# Patient Record
Sex: Male | Born: 1960
Health system: Southern US, Community
[De-identification: ages and names within clinical notes are randomized; demographics above are authoritative.]

## PROBLEM LIST (undated history)

## (undated) DIAGNOSIS — Z86718 Personal history of other venous thrombosis and embolism: Secondary | ICD-10-CM

## (undated) HISTORY — PX: EYE SURGERY: SHX253

## (undated) HISTORY — PX: KNEE SURGERY: SHX244

## (undated) HISTORY — PX: ACHILLES TENDON REPAIR: SUR1153

## (undated) HISTORY — DX: Personal history of other venous thrombosis and embolism: Z86.718

## (undated) HISTORY — PX: HERNIA REPAIR: SHX51

## (undated) HISTORY — PX: ROTATOR CUFF REPAIR: SHX139

---

## 2007-11-21 ENCOUNTER — Emergency Department (HOSPITAL_COMMUNITY): Admission: EM | Admit: 2007-11-21 | Discharge: 2007-11-21 | Payer: Self-pay | Admitting: Emergency Medicine

## 2009-09-21 ENCOUNTER — Emergency Department (HOSPITAL_COMMUNITY): Admission: EM | Admit: 2009-09-21 | Discharge: 2009-09-21 | Payer: Self-pay | Admitting: Family Medicine

## 2009-09-25 ENCOUNTER — Ambulatory Visit (HOSPITAL_BASED_OUTPATIENT_CLINIC_OR_DEPARTMENT_OTHER): Admission: RE | Admit: 2009-09-25 | Discharge: 2009-09-25 | Payer: Self-pay | Admitting: Orthopedic Surgery

## 2010-08-26 LAB — POCT I-STAT, CHEM 8
BUN: 17 mg/dL (ref 6–23)
Chloride: 107 mEq/L (ref 96–112)
Creatinine, Ser: 1 mg/dL (ref 0.4–1.5)
HCT: 48 % (ref 39.0–52.0)
Hemoglobin: 16.3 g/dL (ref 13.0–17.0)
Potassium: 4.8 mEq/L (ref 3.5–5.1)
TCO2: 27 mmol/L (ref 0–100)

## 2011-03-05 LAB — PROTIME-INR: Prothrombin Time: 13.3

## 2016-12-31 ENCOUNTER — Ambulatory Visit (INDEPENDENT_AMBULATORY_CARE_PROVIDER_SITE_OTHER): Payer: BLUE CROSS/BLUE SHIELD | Admitting: Family

## 2016-12-31 ENCOUNTER — Encounter (INDEPENDENT_AMBULATORY_CARE_PROVIDER_SITE_OTHER): Payer: Self-pay | Admitting: *Deleted

## 2016-12-31 ENCOUNTER — Encounter: Payer: Self-pay | Admitting: Family

## 2016-12-31 VITALS — BP 97/67 | HR 119 | Temp 98.8°F | Ht 67.0 in | Wt 189.4 lb

## 2016-12-31 DIAGNOSIS — R000 Tachycardia, unspecified: Secondary | ICD-10-CM | POA: Diagnosis not present

## 2016-12-31 DIAGNOSIS — R509 Fever, unspecified: Secondary | ICD-10-CM | POA: Diagnosis not present

## 2016-12-31 DIAGNOSIS — R5383 Other fatigue: Secondary | ICD-10-CM

## 2016-12-31 DIAGNOSIS — R9431 Abnormal electrocardiogram [ECG] [EKG]: Secondary | ICD-10-CM | POA: Diagnosis not present

## 2016-12-31 DIAGNOSIS — Z1211 Encounter for screening for malignant neoplasm of colon: Secondary | ICD-10-CM | POA: Diagnosis not present

## 2016-12-31 NOTE — Patient Instructions (Signed)
Viral Illness, Adult Viruses are tiny germs that can get into a person's body and cause illness. There are many different types of viruses, and they cause many types of illness. Viral illnesses can range from mild to severe. They can affect various parts of the body. Common illnesses that are caused by a virus include colds and the flu. Viral illnesses also include serious conditions such as HIV/AIDS (human immunodeficiency virus/acquired immunodeficiency syndrome). A few viruses have been linked to certain cancers. What are the causes? Many types of viruses can cause illness. Viruses invade cells in your body, multiply, and cause the infected cells to malfunction or die. When the cell dies, it releases more of the virus. When this happens, you develop symptoms of the illness, and the virus continues to spread to other cells. If the virus takes over the function of the cell, it can cause the cell to divide and grow out of control, as is the case when a virus causes cancer. Different viruses get into the body in different ways. You can get a virus by:  Swallowing food or water that is contaminated with the virus.  Breathing in droplets that have been coughed or sneezed into the air by an infected person.  Touching a surface that has been contaminated with the virus and then touching your eyes, nose, or mouth.  Being bitten by an insect or animal that carries the virus.  Having sexual contact with a person who is infected with the virus.  Being exposed to blood or fluids that contain the virus, either through an open cut or during a transfusion.  If a virus enters your body, your body's defense system (immune system) will try to fight the virus. You may be at higher risk for a viral illness if your immune system is weak. What are the signs or symptoms? Symptoms vary depending on the type of virus and the location of the cells that it invades. Common symptoms of the main types of viral illnesses  include: Cold and flu viruses  Fever.  Headache.  Sore throat.  Muscle aches.  Nasal congestion.  Cough. Digestive system (gastrointestinal) viruses  Fever.  Abdominal pain.  Nausea.  Diarrhea. Liver viruses (hepatitis)  Loss of appetite.  Tiredness.  Yellowing of the skin (jaundice). Brain and spinal cord viruses  Fever.  Headache.  Stiff neck.  Nausea and vomiting.  Confusion or sleepiness. Skin viruses  Warts.  Itching.  Rash. Sexually transmitted viruses  Discharge.  Swelling.  Redness.  Rash. How is this treated? Viruses can be difficult to treat because they live within cells. Antibiotic medicines do not treat viruses because these drugs do not get inside cells. Treatment for a viral illness may include:  Resting and drinking plenty of fluids.  Medicines to relieve symptoms. These can include over-the-counter medicine for pain and fever, medicines for cough or congestion, and medicines to relieve diarrhea.  Antiviral medicines. These drugs are available only for certain types of viruses. They may help reduce flu symptoms if taken early. There are also many antiviral medicines for hepatitis and HIV/AIDS.  Some viral illnesses can be prevented with vaccinations. A common example is the flu shot. Follow these instructions at home: Medicines   Take over-the-counter and prescription medicines only as told by your health care provider.  If you were prescribed an antiviral medicine, take it as told by your health care provider. Do not stop taking the medicine even if you start to feel better.  Be aware   of when antibiotics are needed and when they are not needed. Antibiotics do not treat viruses. If your health care provider thinks that you may have a bacterial infection as well as a viral infection, you may get an antibiotic. ? Do not ask for an antibiotic prescription if you have been diagnosed with a viral illness. That will not make your  illness go away faster. ? Frequently taking antibiotics when they are not needed can lead to antibiotic resistance. When this develops, the medicine no longer works against the bacteria that it normally fights. General instructions  Drink enough fluids to keep your urine clear or pale yellow.  Rest as much as possible.  Return to your normal activities as told by your health care provider. Ask your health care provider what activities are safe for you.  Keep all follow-up visits as told by your health care provider. This is important. How is this prevented? Take these actions to reduce your risk of viral infection:  Eat a healthy diet and get enough rest.  Wash your hands often with soap and water. This is especially important when you are in public places. If soap and water are not available, use hand sanitizer.  Avoid close contact with friends and family who have a viral illness.  If you travel to areas where viral gastrointestinal infection is common, avoid drinking water or eating raw food.  Keep your immunizations up to date. Get a flu shot every year as told by your health care provider.  Do not share toothbrushes, nail clippers, razors, or needles with other people.  Always practice safe sex.  Contact a health care provider if:  You have symptoms of a viral illness that do not go away.  Your symptoms come back after going away.  Your symptoms get worse. Get help right away if:  You have trouble breathing.  You have a severe headache or a stiff neck.  You have severe vomiting or abdominal pain. This information is not intended to replace advice given to you by your health care provider. Make sure you discuss any questions you have with your health care provider. Document Released: 10/04/2015 Document Revised: 11/06/2015 Document Reviewed: 10/04/2015 Elsevier Interactive Patient Education  2018 Elsevier Inc.  

## 2016-12-31 NOTE — Progress Notes (Signed)
Subjective:    Patient ID: Jay Shaw, male    DOB: 1961/02/03, 56 y.o.   MRN: [id number]  PT presents to the office today to establish care and complaints of fever/chills.  Fever   This is a new problem. The current episode started in the past 7 days. The problem occurs intermittently. The problem has been waxing and waning. Associated symptoms include sleepiness. Pertinent negatives include no congestion, coughing, diarrhea, ear pain, muscle aches, nausea, rash, sore throat, urinary pain or vomiting. Associated symptoms comments: Chills . He has tried acetaminophen for the symptoms. The treatment provided mild relief.      Review of Systems  Constitutional: Positive for chills, diaphoresis, fatigue and fever.  HENT: Negative for congestion, ear pain and sore throat.   Respiratory: Negative for cough.   Gastrointestinal: Negative for diarrhea, nausea and vomiting.  Genitourinary: Negative for dysuria.  Skin: Negative for rash.  All other systems reviewed and are negative.  Family History  Problem Relation Age of Onset  . Cancer Father   . COPD Father   . Heart disease Father    Social History   Social History  . Marital status: Married    Spouse name: N/A  . Number of children: N/A  . Years of education: N/A   Social History Main Topics  . Smoking status: Never Smoker  . Smokeless tobacco: Never Used  . Alcohol use No  . Drug use: No  . Sexual activity: Not Asked   Other Topics Concern  . None   Social History Narrative  . None        Objective:   Physical Exam  Constitutional: He is oriented to person, place, and time. He appears well-developed and well-nourished. No distress.  HENT:  Head: Normocephalic.  Right Ear: External ear normal.  Left Ear: External ear normal.  Nose: Nose normal.  Mouth/Throat: Oropharynx is clear and moist.  Eyes: Pupils are equal, round, and reactive to light. Right eye exhibits no discharge. Left eye exhibits no  discharge.  Neck: Normal range of motion. Neck supple. No thyromegaly present.  Cardiovascular: Normal rate, regular rhythm, normal heart sounds and intact distal pulses.   No murmur heard. Pulmonary/Chest: Effort normal and breath sounds normal. No respiratory distress. He has no wheezes.  Abdominal: Soft. Bowel sounds are normal. He exhibits no distension. There is no tenderness.  Musculoskeletal: Normal range of motion. He exhibits no edema or tenderness.  Neurological: He is alert and oriented to person, place, and time.  Skin: Skin is warm and dry. No rash noted. No erythema.  Psychiatric: He has a normal mood and affect. His behavior is normal. Judgment and thought content normal.  Vitals reviewed.   BP 97/67   Pulse (!) 119   Temp 98.8 F (37.1 C) (Oral)   Ht '5\' 7"'  (1.702 m)   Wt 189 lb 6.4 oz (85.9 kg)   BMI 29.66 kg/m      Assessment & Plan:  1. Fever, unspecified fever cause - CMP14+EGFR - CBC with Differential/Platelet - Lyme Ab/Western Blot Reflex - Rocky mtn spotted fvr abs pnl(IgG+IgM)  2. Tachycardia - CMP14+EGFR - CBC with Differential/Platelet - EKG 12-Lead  3. Abnormal EKG - Ambulatory referral to Cardiology  4. Other fatigue - Lyme Ab/Western Blot Reflex - Rocky mtn spotted fvr abs pnl(IgG+IgM) - Ambulatory referral to Cardiology  Labs pending Force fluids  Tylenol as needed Will do referral to Cardiologists  Will call this viral until lab work comes back, if  symptoms worsen or do not improve RTO and will will have further work up  Evelina Dun, FNP

## 2016-12-31 NOTE — Addendum Note (Signed)
Addended by: Evelina Dun A on: 12/31/2016 09:35 AM   Modules accepted: Orders

## 2017-01-04 LAB — CMP14+EGFR
A/G RATIO: 1.3 (ref 1.2–2.2)
ALT: 32 IU/L (ref 0–44)
AST: 38 IU/L (ref 0–40)
Albumin: 3.5 g/dL (ref 3.5–5.5)
Alkaline Phosphatase: 77 IU/L (ref 39–117)
BUN/Creatinine Ratio: 18 (ref 9–20)
BUN: 17 mg/dL (ref 6–24)
Bilirubin Total: 0.9 mg/dL (ref 0.0–1.2)
CALCIUM: 8.9 mg/dL (ref 8.7–10.2)
CO2: 18 mmol/L — AB (ref 20–29)
CREATININE: 0.92 mg/dL (ref 0.76–1.27)
Chloride: 105 mmol/L (ref 96–106)
GFR, EST AFRICAN AMERICAN: 107 mL/min/{1.73_m2} (ref >59)
GFR, EST NON AFRICAN AMERICAN: 93 mL/min/{1.73_m2} (ref >59)
Globulin, Total: 2.8 g/dL (ref 1.5–4.5)
Glucose: 87 mg/dL (ref 65–99)
Potassium: 4.1 mmol/L (ref 3.5–5.2)
Sodium: 143 mmol/L (ref 134–144)
TOTAL PROTEIN: 6.3 g/dL (ref 6.0–8.5)

## 2017-01-04 LAB — CBC WITH DIFFERENTIAL/PLATELET
BASOS ABS: 0.1 10*3/uL (ref 0.0–0.2)
BASOS: 2 %
EOS (ABSOLUTE): 0 10*3/uL (ref 0.0–0.4)
EOS: 0 %
HEMOGLOBIN: 12.7 g/dL — AB (ref 13.0–17.7)
Hematocrit: 37.9 % (ref 37.5–51.0)
IMMATURE GRANS (ABS): 0 10*3/uL (ref 0.0–0.1)
Immature Granulocytes: 1 %
LYMPHS ABS: 3.2 10*3/uL — AB (ref 0.7–3.1)
LYMPHS: 62 %
MCH: 29.3 pg (ref 26.6–33.0)
MCHC: 33.5 g/dL (ref 31.5–35.7)
MCV: 88 fL (ref 79–97)
MONOCYTES: 15 %
Monocytes Absolute: 0.8 10*3/uL (ref 0.1–0.9)
NEUTROS ABS: 1 10*3/uL — AB (ref 1.4–7.0)
NEUTROS PCT: 20 %
PLATELETS: 110 10*3/uL — AB (ref 150–379)
RBC: 4.33 x10E6/uL (ref 4.14–5.80)
RDW: 13.7 % (ref 12.3–15.4)
WBC: 5.2 10*3/uL (ref 3.4–10.8)

## 2017-01-04 LAB — ROCKY MTN SPOTTED FVR ABS PNL(IGG+IGM)
RMSF IGG: NEGATIVE
RMSF IGM: 0.52 {index} (ref 0.00–0.89)

## 2017-01-04 LAB — LYME AB/WESTERN BLOT REFLEX: LYME DISEASE AB, QUANT, IGM: <0.8 index (ref 0.00–0.79)

## 2017-01-18 ENCOUNTER — Encounter: Payer: Self-pay | Admitting: Cardiovascular Disease

## 2017-01-18 ENCOUNTER — Ambulatory Visit (INDEPENDENT_AMBULATORY_CARE_PROVIDER_SITE_OTHER): Payer: BLUE CROSS/BLUE SHIELD | Admitting: Cardiovascular Disease

## 2017-01-18 VITALS — BP 102/68 | HR 67 | Ht 67.0 in | Wt 184.0 lb

## 2017-01-18 DIAGNOSIS — R9431 Abnormal electrocardiogram [ECG] [EKG]: Secondary | ICD-10-CM

## 2017-01-18 NOTE — Patient Instructions (Signed)
Medication Instructions:  Continue all current medications.  Labwork: none  Testing/Procedures:  Your physician has requested that you have an echocardiogram. Echocardiography is a painless test that uses sound waves to create images of your heart. It provides your doctor with information about the size and shape of your heart and how well your heart's chambers and valves are working. This procedure takes approximately one hour. There are no restrictions for this procedure.  Office will contact with results via phone or letter.    Follow-Up: To be determined.    Any Other Special Instructions Will Be Listed Below (If Applicable).  If you need a refill on your cardiac medications before your next appointment, please call your pharmacy.  

## 2017-01-18 NOTE — Progress Notes (Signed)
CARDIOLOGY CONSULT NOTE  Patient ID: Jay Shaw MRN: [id number] DOB/AGE: 1961-02-15 56 y.o.  Admit date: (Not on file) Primary Physician: Sharion Balloon, FNP Referring Physician: Lenna Gilford  Reason for Consultation: abnormal ECG  HPI: Jay Shaw is a 56 y.o. male who is being seen today for the evaluation of abnormal ECG at the request of Sharion Balloon, FNP.   He was evaluated for a fever by his PCP in late July 2018. Lyme titers and testing from [facility] spotted fever were negative.  ECG performed that day which I personally interpreted demonstrated sinus rhythm with left axis deviation and late R-wave transition suggestive of anterior infarct.  He takes no medications.  His only significant past medical history is a provoked right leg DVT 5 years ago. At that time his job entailed extensive airplane and car travel. He has no family history of clotting disorders.  He walks about 2.75 miles twice per week. He has been on a ketogenic diet and has lost 28 pounds in the last several months.  He does yard work regularly.  The patient denies any symptoms of chest pain, palpitations, shortness of breath, lightheadedness, dizziness, leg swelling, orthopnea, PND, and syncope.  Family history: His father was a smoker and alcoholic. He had CAD and CVA and died of COPD at the age of 13. His mom was a smoker.  Soc Hx: He is originally from [place], [place]. He owns a [employer]. His father was an Therapist, music and [place] for 40 years and worked for [employer] ". The patient studied at the [employer] [place].   No Known Allergies  No current outpatient prescriptions on file.   No current facility-administered medications for this visit.     Past Medical History:  Diagnosis Date  . Hx of blood clots    blood clot lower extrmity    Past Surgical History:  Procedure  Laterality Date  . ACHILLES TENDON REPAIR Left   . EYE SURGERY    . HERNIA REPAIR    . KNEE SURGERY Right   . ROTATOR CUFF REPAIR Right     Social History   Social History  . Marital status: Married    Spouse name: N/A  . Number of children: N/A  . Years of education: N/A   Occupational History  . Not on file.   Social History Main Topics  . Smoking status: Never Smoker  . Smokeless tobacco: Never Used  . Alcohol use No  . Drug use: No  . Sexual activity: Not on file   Other Topics Concern  . Not on file   Social History Narrative  . No narrative on file      No outpatient prescriptions have been marked as taking for the 01/18/17 encounter (Office Visit) with Herminio Commons, MD.      Review of systems complete and found to be negative unless listed above in HPI    Physical exam Blood pressure 102/68, pulse 67, height 5\' 7"  (1.702 m), weight 184 lb (83.5 kg), SpO2 97 %. General: NAD Neck: No JVD, no thyromegaly or thyroid nodule.  Lungs: Clear to auscultation bilaterally with normal respiratory effort. CV: Nondisplaced PMI. Regular rate and rhythm, normal S1/S2, no S3/S4, no murmur.  No peripheral edema.  No carotid bruit.    Abdomen: Soft, nontender, no distention.  Skin: Intact without lesions or rashes.  Neurologic: Alert and oriented x 3.  Psych: Normal affect. Extremities: No clubbing or cyanosis.  HEENT: Normal.   ECG: Most recent ECG reviewed.   Labs: Lab Results  Component Value Date/Time   K 4.1 12/31/2016 09:35 AM   BUN 17 12/31/2016 09:35 AM   CREATININE 0.92 12/31/2016 09:35 AM   ALT 32 12/31/2016 09:35 AM   HGB 12.7 (L) 12/31/2016 09:35 AM     Lipids: No results found for: LDLCALC, LDLDIRECT, CHOL, TRIG, HDL      ASSESSMENT AND PLAN:  1. Abnormal ECG: Given the lack of cardiovascular risk factors and lack of symptoms, I suspect this is a nonspecific finding. However, I will order a 2-D echocardiogram with Doppler to evaluate  cardiac structure, function, and regional wall motion.    Disposition: Follow up to be determined.   Signed: Kate Sable, M.D., F.A.C.C.  01/18/2017, 8:24 AM

## 2017-02-04 ENCOUNTER — Other Ambulatory Visit: Payer: Self-pay

## 2017-02-04 ENCOUNTER — Ambulatory Visit (INDEPENDENT_AMBULATORY_CARE_PROVIDER_SITE_OTHER): Payer: BLUE CROSS/BLUE SHIELD

## 2017-02-04 DIAGNOSIS — R9431 Abnormal electrocardiogram [ECG] [EKG]: Secondary | ICD-10-CM | POA: Diagnosis not present

## 2017-02-05 ENCOUNTER — Telehealth: Payer: Self-pay | Admitting: *Deleted

## 2017-02-05 NOTE — Telephone Encounter (Signed)
Notes recorded by Laurine Blazer, LPN on 12/01/6387 at 3:73 PM EDT Patient notified. Copy to pmd. ------  Notes recorded by Herminio Commons, MD on 02/05/2017 at 8:10 AM EDT Normal.

## 2018-12-14 ENCOUNTER — Encounter: Payer: Self-pay | Admitting: Family Medicine

## 2018-12-14 ENCOUNTER — Other Ambulatory Visit: Payer: Self-pay

## 2018-12-14 ENCOUNTER — Ambulatory Visit (INDEPENDENT_AMBULATORY_CARE_PROVIDER_SITE_OTHER): Payer: BC Managed Care – PPO | Admitting: Family Medicine

## 2018-12-14 DIAGNOSIS — H6593 Unspecified nonsuppurative otitis media, bilateral: Secondary | ICD-10-CM | POA: Diagnosis not present

## 2018-12-14 DIAGNOSIS — H9202 Otalgia, left ear: Secondary | ICD-10-CM

## 2018-12-14 MED ORDER — FLUTICASONE PROPIONATE 50 MCG/ACT NA SUSP: 2.0000 | Freq: Every day | NASAL | 6 refills | Status: DC

## 2018-12-14 MED ORDER — AMOXICILLIN 875 MG PO TABS: 875.0000 mg | ORAL_TABLET | Freq: Two times a day (BID) | ORAL | 0 refills | Status: DC

## 2018-12-14 MED ORDER — LEVOCETIRIZINE DIHYDROCHLORIDE 5 MG PO TABS
5.0000 mg | ORAL_TABLET | Freq: Every evening | ORAL | 3 refills | Status: DC
Start: 2018-12-14 — End: 2021-06-18

## 2018-12-14 NOTE — Progress Notes (Signed)
Virtual Visit via telephone Note Due to COVID-19, visit is conducted virtually and was requested by patient. This visit type was conducted due to national recommendations for restrictions regarding the COVID-19 Pandemic (e.g. social distancing) in an effort to limit this patient's exposure and mitigate transmission in our community. All issues noted in this document were discussed and addressed.  A physical exam was not performed with this format.   I connected with Jay Shaw on 12/14/18 at 1155 by telephone and verified that I am speaking with the correct person using two identifiers. Jay Shaw is currently located at home and no one is currently with them during visit. The provider, Monia Pouch, FNP is located in their home office at time of visit, working remotely.  I discussed the limitations, risks, security and privacy concerns of performing an evaluation and management service by telephone and the availability of in person appointments. I also discussed with the patient that there may be a patient responsible charge related to this service. The patient expressed understanding and agreed to proceed.  Subjective:  Patient ID: Jay Shaw, male    DOB: 11-16-1960, 58 y.o.   MRN: [id number]  Chief Complaint:  Otalgia and Ear Fullness   HPI: Jay Shaw is a 58 y.o. male presenting on 12/14/2018 for Otalgia and Ear Fullness   Pt reports ongoing bilateral ear fullness and tinnitus. States this started several weeks ago. States the symptoms wax and wane. States on Monday he developed left ear pain. States he has a 4/10 aching pain in his ear. States this has been constant. He has tried peroxide and herbal remedies without relief of symptoms. States he does have decreased hearing in both ears.   Otalgia  There is pain in the left ear. This is a new problem. The current episode started in the past 7 days. The problem occurs constantly. There has been no fever. The pain is at a  severity of 4/10. The pain is mild. Associated symptoms include hearing loss. Pertinent negatives include no abdominal pain, coughing, diarrhea, ear discharge, headaches, neck pain, rash, rhinorrhea, sore throat or vomiting. The treatment provided no relief.  Ear Fullness  There is pain in both ears. This is a recurrent problem. The current episode started 1 to 4 weeks ago. The problem occurs every few hours. The problem has been waxing and waning. There has been no fever. Associated symptoms include hearing loss. Pertinent negatives include no abdominal pain, coughing, diarrhea, ear discharge, headaches, neck pain, rash, rhinorrhea, sore throat or vomiting. He has tried nothing for the symptoms.     Relevant past medical, surgical, family, and social history reviewed and updated as indicated.  Allergies and medications reviewed and updated.   Past Medical History:  Diagnosis Date  . Hx of blood clots    blood clot lower extrmity    Past Surgical History:  Procedure Laterality Date  . ACHILLES TENDON REPAIR Left   . EYE SURGERY    . HERNIA REPAIR    . KNEE SURGERY Right   . ROTATOR CUFF REPAIR Right     Social History   Socioeconomic History  . Marital status: Married    Spouse name: Not on file  . Number of children: Not on file  . Years of education: Not on file  . Highest education level: Not on file  Occupational History  . Not on file  Social Needs  . Financial resource strain: Not on file  . Food insecurity    Worry:  Not on file    Inability: Not on file  . Transportation needs    Medical: Not on file    Non-medical: Not on file  Tobacco Use  . Smoking status: Never Smoker  . Smokeless tobacco: Never Used  Substance and Sexual Activity  . Alcohol use: No  . Drug use: No  . Sexual activity: Not on file  Lifestyle  . Physical activity    Days per week: Not on file    Minutes per session: Not on file  . Stress: Not on file  Relationships  . Social  Herbalist on phone: Not on file    Gets together: Not on file    Attends religious service: Not on file    Active member of club or organization: Not on file    Attends meetings of clubs or organizations: Not on file    Relationship status: Not on file  . Intimate partner violence    Fear of current or ex partner: Not on file    Emotionally abused: Not on file    Physically abused: Not on file    Forced sexual activity: Not on file  Other Topics Concern  . Not on file  Social History Narrative  . Not on file    Outpatient Encounter Medications as of 12/14/2018  Medication Sig  . amoxicillin (AMOXIL) 875 MG tablet Take 1 tablet (875 mg total) by mouth 2 (two) times daily. 1 po BID  . fluticasone (FLONASE) 50 MCG/ACT nasal spray Place 2 sprays into both nostrils daily.  Marland Kitchen levocetirizine (XYZAL) 5 MG tablet Take 1 tablet (5 mg total) by mouth every evening.   No facility-administered encounter medications on file as of 12/14/2018.     No Known Allergies  Review of Systems  Constitutional: Negative for chills, fatigue and fever.  HENT: Positive for ear pain, hearing loss and tinnitus. Negative for congestion, dental problem, drooling, ear discharge, facial swelling, mouth sores, nosebleeds, postnasal drip, rhinorrhea, sinus pressure, sinus pain, sneezing, sore throat, trouble swallowing and voice change.   Respiratory: Negative for cough and shortness of breath.   Cardiovascular: Negative for chest pain and palpitations.  Gastrointestinal: Negative for abdominal pain, diarrhea and vomiting.  Musculoskeletal: Negative for arthralgias, myalgias and neck pain.  Skin: Negative for rash.  Neurological: Negative for dizziness, syncope, weakness, light-headedness and headaches.  Psychiatric/Behavioral: Negative for confusion.  All other systems reviewed and are negative.        Observations/Objective: No vital signs or physical exam, this was a telephone or virtual health  encounter.  Pt alert and oriented, answers all questions appropriately, and able to speak in full sentences.    Assessment and Plan: Jay Shaw was seen today for otalgia and ear fullness.  Diagnoses and all orders for this visit:  Otalgia of left ear Reported symptoms consistent with left AOM. Symptomatic care discussed. Report any new or worsening symptoms. Medications as prescribed. Follow up in 2 weeks for reevaluation.  -     amoxicillin (AMOXIL) 875 MG tablet; Take 1 tablet (875 mg total) by mouth 2 (two) times daily. 1 po BID  Bilateral otitis media with effusion Reported symptoms consistent with bilateral otitis media with effusion. Will trial below. Follow up in 2 weeks for reevaluation. May need referral to ENT if symptoms persist.  -     fluticasone (FLONASE) 50 MCG/ACT nasal spray; Place 2 sprays into both nostrils daily. -     levocetirizine (XYZAL) 5 MG tablet; Take  1 tablet (5 mg total) by mouth every evening.     Follow Up Instructions: Return in about 2 weeks (around 12/28/2018), or if symptoms worsen or fail to improve, for ear recheck.    I discussed the assessment and treatment plan with the patient. The patient was provided an opportunity to ask questions and all were answered. The patient agreed with the plan and demonstrated an understanding of the instructions.   The patient was advised to call back or seek an in-person evaluation if the symptoms worsen or if the condition fails to improve as anticipated.  The above assessment and management plan was discussed with the patient. The patient verbalized understanding of and has agreed to the management plan. Patient is aware to call the clinic if symptoms persist or worsen. Patient is aware when to return to the clinic for a follow-up visit. Patient educated on when it is appropriate to go to the emergency department.    I provided 15 minutes of non-face-to-face time during this encounter. The call started at 1155.  The call ended at 1210. The other time was used for coordination of care.    Monia Pouch, FNP-C [facility] [address] [place], [place] [address] [phone]

## 2019-01-02 ENCOUNTER — Encounter: Payer: Self-pay | Admitting: Family

## 2019-01-02 ENCOUNTER — Ambulatory Visit (INDEPENDENT_AMBULATORY_CARE_PROVIDER_SITE_OTHER): Payer: BC Managed Care – PPO | Admitting: Family

## 2019-01-02 DIAGNOSIS — H938X2 Other specified disorders of left ear: Secondary | ICD-10-CM

## 2019-01-02 DIAGNOSIS — H9312 Tinnitus, left ear: Secondary | ICD-10-CM | POA: Diagnosis not present

## 2019-01-02 NOTE — Progress Notes (Signed)
   Virtual Visit via telephone Note  I connected with Jay Shaw on 01/02/19 at 10:56 AM by telephone and verified that I am speaking with the correct person using two identifiers. Jay Shaw is currently located at home and no one  is currently with her during visit. The provider, Evelina Dun, FNP is located in their office at time of visit.  I discussed the limitations, risks, security and privacy concerns of performing an evaluation and management service by telephone and the availability of in person appointments. I also discussed with the patient that there may be a patient responsible charge related to this service. The patient expressed understanding and agreed to proceed.   History and Present Illness:  PT presents to the office today with recurrent left ear fullness. He had a televisit on 12/14/2018 and given amoxicillin, Xyzal, and Flonase. He states he also has tinnitus that has been going on for greater than 6 months. 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 This is a recurrent problem. The current episode started 1 to 4 weeks ago. The problem occurs constantly. The problem has been waxing and waning. There has been no fever. The pain is at a severity of 0/10. The patient is experiencing no pain. Associated symptoms include hearing loss. Pertinent negatives include no coughing, diarrhea, ear discharge, headaches or sore throat. He has tried antibiotics for the symptoms. The treatment provided mild relief.      Review of Systems  HENT: Positive for hearing loss and tinnitus. Negative for ear discharge and sore throat.   Respiratory: Negative for cough.   Gastrointestinal: Negative for diarrhea.  Neurological: Negative for headaches.  All other systems reviewed and are negative.    Observations/Objective: No SOB or distress noted   Assessment and Plan: Jay Shaw comes in today with chief complaint of  No chief complaint on file.   Diagnosis and orders addressed:  1. Sensation of fullness in left ear  2. Tinnitus of left ear  Given he is not having any pain, fever, cough, or SOB we will bring him in tomorrow to check his ears as it may be cerumen impaction. He states he does have a hx of cerumen impaction. If ears are clear we will do a referral to ENT.  He will also make CPE appt    I discussed the assessment and treatment plan with the patient. The patient was provided an opportunity to ask questions and all were answered. The patient agreed with the plan and demonstrated an understanding of the instructions.   The patient was advised to call back or seek an in-person evaluation if the symptoms worsen or if the condition fails to improve as anticipated.  The above assessment and management plan was discussed with the patient. The patient verbalized understanding of and has agreed to the management plan. Patient is aware to call the clinic if symptoms persist or worsen. Patient is aware when to return to the clinic for a follow-up visit. Patient educated on when it is appropriate to go to the emergency department.   Time call ended:  11:12 AM  I provided 15 minutes of non-face-to-face time during this encounter.    Evelina Dun, FNP

## 2019-01-03 ENCOUNTER — Ambulatory Visit: Payer: BC Managed Care – PPO | Admitting: Family

## 2019-01-03 ENCOUNTER — Other Ambulatory Visit: Payer: Self-pay

## 2019-01-03 ENCOUNTER — Encounter: Payer: Self-pay | Admitting: Family

## 2019-01-03 VITALS — BP 117/71 | HR 97 | Temp 98.4°F | Ht 67.0 in | Wt 179.4 lb

## 2019-01-03 DIAGNOSIS — H9312 Tinnitus, left ear: Secondary | ICD-10-CM

## 2019-01-03 DIAGNOSIS — H6122 Impacted cerumen, left ear: Secondary | ICD-10-CM | POA: Diagnosis not present

## 2019-01-03 NOTE — Patient Instructions (Signed)
Earwax Buildup, Adult The ears produce a substance called earwax that helps keep bacteria out of the ear and protects the skin in the ear canal. Occasionally, earwax can build up in the ear and cause discomfort or hearing loss. What increases the risk? This condition is more likely to develop in people who:  Are male.  Are elderly.  Naturally produce more earwax.  Clean their ears often with cotton swabs.  Use earplugs often.  Use in-ear headphones often.  Wear hearing aids.  Have narrow ear canals.  Have earwax that is overly thick or sticky.  Have eczema.  Are dehydrated.  Have excess hair in the ear canal. What are the signs or symptoms? Symptoms of this condition include:  Reduced or muffled hearing.  A feeling of fullness in the ear or feeling that the ear is plugged.  Fluid coming from the ear.  Ear pain.  Ear itch.  Ringing in the ear.  Coughing.  An obvious piece of earwax that can be seen inside the ear canal. How is this diagnosed? This condition may be diagnosed based on:  Your symptoms.  Your medical history.  An ear exam. During the exam, your health care provider will look into your ear with an instrument called an otoscope. You may have tests, including a hearing test. How is this treated? This condition may be treated by:  Using ear drops to soften the earwax.  Having the earwax removed by a health care provider. The health care provider may: ? Flush the ear with water. ? Use an instrument that has a loop on the end (curette). ? Use a suction device.  Surgery to remove the wax buildup. This may be done in severe cases. Follow these instructions at home:   Take over-the-counter and prescription medicines only as told by your health care provider.  Do not put any objects, including cotton swabs, into your ear. You can clean the opening of your ear canal with a washcloth or facial tissue.  Follow instructions from your health care  provider about cleaning your ears. Do not over-clean your ears.  Drink enough fluid to keep your urine clear or pale yellow. This will help to thin the earwax.  Keep all follow-up visits as told by your health care provider. If earwax builds up in your ears often or if you use hearing aids, consider seeing your health care provider for routine, preventive ear cleanings. Ask your health care provider how often you should schedule your cleanings.  If you have hearing aids, clean them according to instructions from the manufacturer and your health care provider. Contact a health care provider if:  You have ear pain.  You develop a fever.  You have blood, pus, or other fluid coming from your ear.  You have hearing loss.  You have ringing in your ears that does not go away.  Your symptoms do not improve with treatment.  You feel like the room is spinning (vertigo). Summary  Earwax can build up in the ear and cause discomfort or hearing loss.  The most common symptoms of this condition include reduced or muffled hearing and a feeling of fullness in the ear or feeling that the ear is plugged.  This condition may be diagnosed based on your symptoms, your medical history, and an ear exam.  This condition may be treated by using ear drops to soften the earwax or by having the earwax removed by a health care provider.  Do not put any   objects, including cotton swabs, into your ear. You can clean the opening of your ear canal with a washcloth or facial tissue. This information is not intended to replace advice given to you by your health care provider. Make sure you discuss any questions you have with your health care provider. Document Released: 07/02/2004 Document Revised: 05/07/2017 Document Reviewed: 08/05/2016 Elsevier Patient Education  2020 [employer]  

## 2019-01-03 NOTE — Progress Notes (Signed)
Subjective:    Patient ID: Jay Shaw, male    DOB: Apr 27, 1961, 58 y.o.   MRN: [id number]  Chief Complaint  Patient presents with  . Ear Fullness   PT presents to the office today with left ear fullness and Tinnitus. 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  Ear Fullness  There is pain in the left ear. This is a recurrent problem. The current episode started more than 1 month ago. The problem occurs constantly. The problem has been waxing and waning. There has been no fever. The pain is at a severity of 1/10. The pain is mild. Associated symptoms include hearing loss. Pertinent negatives include no coughing, diarrhea, headaches or sore throat. He has tried antibiotics, acetaminophen and NSAIDs for the symptoms. The treatment provided no relief.      Review of Systems  HENT: Positive for hearing loss. Negative for sore throat.   Respiratory: Negative for cough.   Gastrointestinal: Negative for diarrhea.  Neurological: Negative for headaches.  All other systems reviewed and are negative.      Objective:   Physical Exam Vitals signs reviewed.  Constitutional:      General: He is not in acute distress.    Appearance: He is well-developed.  HENT:     Head: Normocephalic.     Right Ear: Tympanic membrane normal.     Left Ear: Tympanic membrane normal. There is impacted cerumen.  Eyes:     General:        Right eye: No discharge.        Left eye: No discharge.     Pupils: Pupils are equal, round, and reactive to light.  Neck:     Musculoskeletal: Normal range of motion and neck supple.     Thyroid: No thyromegaly.  Cardiovascular:     Rate and Rhythm: Normal rate and regular rhythm.     Heart sounds: Normal heart sounds. No murmur.  Pulmonary:     Effort: Pulmonary effort is normal. No respiratory distress.     Breath sounds: Normal breath sounds. No wheezing.  Abdominal:     General: Bowel sounds are normal. There is no distension.   Palpations: Abdomen is soft.     Tenderness: There is no abdominal tenderness.  Musculoskeletal: Normal range of motion.        General: No tenderness.  Skin:    General: Skin is warm and dry.     Findings: No erythema or rash.  Neurological:     Mental Status: He is alert and oriented to person, place, and time.     Cranial Nerves: No cranial nerve deficit.     Deep Tendon Reflexes: Reflexes are normal and symmetric.  Psychiatric:        Behavior: Behavior normal.        Thought Content: Thought content normal.        Judgment: Judgment normal.      BP 117/71   Pulse 97   Temp 98.4 F (36.9 C) (Oral)   Ht 5\' 7"  (1.702 m)   Wt 179 lb 6.4 oz (81.4 kg)   SpO2 97%   BMI 28.10 kg/m       Assessment & Plan:  Jay Shaw comes in today with chief complaint of Ear Fullness   Diagnosis and orders addressed:  1. Impacted cerumen of left ear -Keep clean and dry Do not stick anything into ear - Take allergy medication as needed  2. Tinnitus of  left ear -discussed there is no "cure"  Evelina Dun, FNP

## 2021-03-10 ENCOUNTER — Telehealth: Payer: Self-pay | Admitting: Family

## 2021-03-10 NOTE — Telephone Encounter (Signed)
Patient last seen here in 2020.  Patient reports he has a history of blood clot in leg and has felt for quite some time that something is going on again.  He has no redness, swelling, pain or warmth in leg but states it just doesn't feel right to him.  Patient was offered appointment today but he declines and said he cannot come until Thursday.  Appointment scheduled with Monia Pouch on Thursday at 10:05 am.  Patient cautioned.

## 2021-03-13 ENCOUNTER — Encounter: Payer: Self-pay | Admitting: Family Medicine

## 2021-03-13 ENCOUNTER — Other Ambulatory Visit: Payer: Self-pay

## 2021-03-13 ENCOUNTER — Ambulatory Visit: Payer: No Typology Code available for payment source | Admitting: Family Medicine

## 2021-03-13 VITALS — BP 118/71 | HR 100 | Temp 97.8°F | Ht 67.0 in | Wt 186.0 lb

## 2021-03-13 DIAGNOSIS — M79661 Pain in right lower leg: Secondary | ICD-10-CM | POA: Diagnosis not present

## 2021-03-13 DIAGNOSIS — Z86718 Personal history of other venous thrombosis and embolism: Secondary | ICD-10-CM | POA: Diagnosis not present

## 2021-03-13 DIAGNOSIS — R252 Cramp and spasm: Secondary | ICD-10-CM | POA: Diagnosis not present

## 2021-03-13 DIAGNOSIS — R7989 Other specified abnormal findings of blood chemistry: Secondary | ICD-10-CM

## 2021-03-13 NOTE — Progress Notes (Signed)
Subjective:  Patient ID: Jay Shaw, male    DOB: May 17, 1961, 60 y.o.   MRN: [id number]  Patient Care Team: Sharion Balloon, FNP as PCP - General (Family Medicine)   Chief Complaint:  Medical Management of Chronic Issues and concern for blood clots   HPI: Jay Shaw is a 60 y.o. male presenting on 03/13/2021 for Medical Management of Chronic Issues and concern for blood clots   Pt presents today with complaints of right calf cramping and pain. States he had a DVT in this leg years ago and calf has been larger than left since DVT. He has not had recurrent DVTs. He does travel all of the time for work, flying and driving. He denies increased swelling of right lower extremity. No chest pain, shortness of breath, palpitations, diaphoresis, dizziness, feelings of impending doom, or syncope.    Relevant past medical, surgical, family, and social history reviewed and updated as indicated.  Allergies and medications reviewed and updated. Data reviewed: Chart in Epic.   Past Medical History:  Diagnosis Date   Hx of blood clots    blood clot lower extrmity    Past Surgical History:  Procedure Laterality Date   ACHILLES TENDON REPAIR Left    EYE SURGERY     HERNIA REPAIR     KNEE SURGERY Right    ROTATOR CUFF REPAIR Right     Social History   Socioeconomic History   Marital status: Married    Spouse name: Not on file   Number of children: Not on file   Years of education: Not on file   Highest education level: Not on file  Occupational History   Not on file  Tobacco Use   Smoking status: Never   Smokeless tobacco: Never  Vaping Use   Vaping Use: Never used  Substance and Sexual Activity   Alcohol use: No   Drug use: No   Sexual activity: Not on file  Other Topics Concern   Not on file  Social History Narrative   Not on file   Social Determinants of Health   Financial Resource Strain: Not on file  Food Insecurity: Not on file  Transportation Needs: Not  on file  Physical Activity: Not on file  Stress: Not on file  Social Connections: Not on file  Intimate Partner Violence: Not on file    Outpatient Encounter Medications as of 03/13/2021  Medication Sig   levocetirizine (XYZAL) 5 MG tablet Take 1 tablet (5 mg total) by mouth every evening.   [DISCONTINUED] fluticasone (FLONASE) 50 MCG/ACT nasal spray Place 2 sprays into both nostrils daily.   No facility-administered encounter medications on file as of 03/13/2021.    No Known Allergies  Review of Systems  Constitutional:  Negative for activity change, appetite change, chills, diaphoresis, fatigue, fever and unexpected weight change.  HENT: Negative.    Eyes: Negative.   Respiratory:  Negative for apnea, cough, choking, chest tightness, shortness of breath, wheezing and stridor.   Cardiovascular:  Negative for chest pain, palpitations and leg swelling.  Gastrointestinal:  Negative for abdominal pain, blood in stool, constipation, diarrhea, nausea and vomiting.  Endocrine: Negative.   Genitourinary:  Negative for decreased urine volume, difficulty urinating, dysuria, frequency and urgency.  Musculoskeletal:  Positive for myalgias. Negative for arthralgias.  Skin: Negative.   Allergic/Immunologic: Negative.   Neurological:  Negative for dizziness, tremors, seizures, syncope, facial asymmetry, speech difficulty, weakness, light-headedness, numbness and headaches.  Hematological: Negative.   Psychiatric/Behavioral:  Negative for confusion, hallucinations, sleep disturbance and suicidal ideas.   All other systems reviewed and are negative.      Objective:  BP 118/71   Pulse 100   Temp 97.8 F (36.6 C)   Ht '5\' 7"'  (1.702 m)   Wt 186 lb (84.4 kg)   SpO2 97%   BMI 29.13 kg/m    Wt Readings from Last 3 Encounters:  03/13/21 186 lb (84.4 kg)  01/03/19 179 lb 6.4 oz (81.4 kg)  01/18/17 184 lb (83.5 kg)    Physical Exam Vitals and nursing note reviewed.  Constitutional:       General: He is not in acute distress.    Appearance: Normal appearance. He is well-developed and well-groomed. He is not ill-appearing, toxic-appearing or diaphoretic.  HENT:     Head: Normocephalic and atraumatic.     Jaw: There is normal jaw occlusion.     Right Ear: Hearing normal.     Left Ear: Hearing normal.     Nose: Nose normal.     Mouth/Throat:     Lips: Pink.     Mouth: Mucous membranes are moist.     Pharynx: Oropharynx is clear. Uvula midline.  Eyes:     General: Lids are normal.     Extraocular Movements: Extraocular movements intact.     Conjunctiva/sclera: Conjunctivae normal.     Pupils: Pupils are equal, round, and reactive to light.  Neck:     Thyroid: No thyroid mass, thyromegaly or thyroid tenderness.     Vascular: No carotid bruit or JVD.     Trachea: Trachea and phonation normal.  Cardiovascular:     Rate and Rhythm: Normal rate and regular rhythm.     Chest Wall: PMI is not displaced.     Pulses: Normal pulses.     Heart sounds: Normal heart sounds. No murmur heard.   No friction rub. No gallop.     Comments: VV noted to bilateral lower extremities. Right calf slightly larger than left. No tenderness or erythema.  Pulmonary:     Effort: Pulmonary effort is normal. No respiratory distress.     Breath sounds: Normal breath sounds. No wheezing.  Abdominal:     General: Bowel sounds are normal. There is no distension or abdominal bruit.     Palpations: Abdomen is soft. There is no hepatomegaly or splenomegaly.     Tenderness: There is no abdominal tenderness. There is no right CVA tenderness or left CVA tenderness.     Hernia: No hernia is present.  Musculoskeletal:        General: Normal range of motion.     Cervical back: Normal range of motion and neck supple.     Right lower leg: No edema.     Left lower leg: No edema.  Lymphadenopathy:     Cervical: No cervical adenopathy.  Skin:    General: Skin is warm and dry.     Capillary Refill: Capillary  refill takes less than 2 seconds.     Coloration: Skin is not cyanotic, jaundiced or pale.     Findings: No rash.  Neurological:     General: No focal deficit present.     Mental Status: He is alert and oriented to person, place, and time.     Cranial Nerves: Cranial nerves are intact.     Sensory: Sensation is intact.     Motor: Motor function is intact.     Coordination: Coordination is intact.  Gait: Gait is intact.     Deep Tendon Reflexes: Reflexes are normal and symmetric.  Psychiatric:        Attention and Perception: Attention and perception normal.        Mood and Affect: Mood and affect normal.        Speech: Speech normal.        Behavior: Behavior normal. Behavior is cooperative.        Thought Content: Thought content normal.        Cognition and Memory: Cognition and memory normal.        Judgment: Judgment normal.    Results for orders placed or performed in visit on 12/31/16  CMP14+EGFR  Result Value Ref Range   Glucose 87 65 - 99 mg/dL   BUN 17 6 - 24 mg/dL   Creatinine, Ser 0.92 0.76 - 1.27 mg/dL   GFR calc non Af Amer 93 >59 mL/min/1.73   GFR calc Af Amer 107 >59 mL/min/1.73   BUN/Creatinine Ratio 18 9 - 20   Sodium 143 134 - 144 mmol/L   Potassium 4.1 3.5 - 5.2 mmol/L   Chloride 105 96 - 106 mmol/L   CO2 18 (L) 20 - 29 mmol/L   Calcium 8.9 8.7 - 10.2 mg/dL   Total Protein 6.3 6.0 - 8.5 g/dL   Albumin 3.5 3.5 - 5.5 g/dL   Globulin, Total 2.8 1.5 - 4.5 g/dL   Albumin/Globulin Ratio 1.3 1.2 - 2.2   Bilirubin Total 0.9 0.0 - 1.2 mg/dL   Alkaline Phosphatase 77 39 - 117 IU/L   AST 38 0 - 40 IU/L   ALT 32 0 - 44 IU/L  CBC with Differential/Platelet  Result Value Ref Range   WBC 5.2 3.4 - 10.8 x10E3/uL   RBC 4.33 4.14 - 5.80 x10E6/uL   Hemoglobin 12.7 (L) 13.0 - 17.7 g/dL   Hematocrit 37.9 37.5 - 51.0 %   MCV 88 79 - 97 fL   MCH 29.3 26.6 - 33.0 pg   MCHC 33.5 31.5 - 35.7 g/dL   RDW 13.7 12.3 - 15.4 %   Platelets 110 (L) 150 - 379 x10E3/uL    Neutrophils 20 Not Estab. %   Lymphs 62 Not Estab. %   Monocytes 15 Not Estab. %   Eos 0 Not Estab. %   Basos 2 Not Estab. %   Neutrophils Absolute 1.0 (L) 1.4 - 7.0 x10E3/uL   Lymphocytes Absolute 3.2 (H) 0.7 - 3.1 x10E3/uL   Monocytes Absolute 0.8 0.1 - 0.9 x10E3/uL   EOS (ABSOLUTE) 0.0 0.0 - 0.4 x10E3/uL   Basophils Absolute 0.1 0.0 - 0.2 x10E3/uL   Immature Granulocytes 1 Not Estab. %   Immature Grans (Abs) 0.0 0.0 - 0.1 x10E3/uL   Hematology Comments: Note:   Lyme Ab/Western Blot Reflex  Result Value Ref Range   Lyme IgG/IgM Ab <0.91 0.00 - 0.90 ISR   LYME DISEASE AB, QUANT, IGM <0.80 0.00 - 0.79 index  Rocky mtn spotted fvr abs pnl(IgG+IgM)  Result Value Ref Range   RMSF IgG Negative Negative   RMSF IgM 0.52 0.00 - 0.89 index       Pertinent labs & imaging results that were available during my care of the patient were reviewed by me and considered in my medical decision making.  Assessment & Plan:  Kay was seen today for medical management of chronic issues and concern for blood clots.  Diagnoses and all orders for this visit:  Pain of right calf History  of DVT of lower extremity Calf cramp Complaints of right calf pain and swelling with prior history of DVT. He does travel daily. Will check CBC, electrolytes, and D-dimer. Likely not a DVT. No symptoms of PE. If D-dimer is positive, will order imaging.  -     CBC with Differential/Platelet -     CMP14+EGFR -     D-dimer, quantitative    Continue all other maintenance medications.  Follow up plan: Return in about 1 month (around 04/13/2021), or if symptoms worsen or fail to improve, for CPE.   Continue healthy lifestyle choices, including diet (rich in fruits, vegetables, and lean proteins, and low in salt and simple carbohydrates) and exercise (at least 30 minutes of moderate physical activity daily).   The above assessment and management plan was discussed with the patient. The patient verbalized  understanding of and has agreed to the management plan. Patient is aware to call the clinic if they develop any new symptoms or if symptoms persist or worsen. Patient is aware when to return to the clinic for a follow-up visit. Patient educated on when it is appropriate to go to the emergency department.   Monia Pouch, FNP-C [facility] [phone]

## 2021-03-14 ENCOUNTER — Ambulatory Visit (HOSPITAL_COMMUNITY)
Admission: RE | Admit: 2021-03-14 | Discharge: 2021-03-14 | Disposition: A | Discharge status: Home / Self Care | Payer: No Typology Code available for payment source | Source: Ambulatory Visit | Attending: Family Medicine | Admitting: Family Medicine

## 2021-03-14 DIAGNOSIS — M79661 Pain in right lower leg: Secondary | ICD-10-CM | POA: Diagnosis present

## 2021-03-14 DIAGNOSIS — R7989 Other specified abnormal findings of blood chemistry: Secondary | ICD-10-CM | POA: Diagnosis present

## 2021-03-14 DIAGNOSIS — Z86718 Personal history of other venous thrombosis and embolism: Secondary | ICD-10-CM | POA: Diagnosis not present

## 2021-03-14 LAB — CMP14+EGFR
ALT: 21 IU/L (ref 0–44)
AST: 15 IU/L (ref 0–40)
Albumin/Globulin Ratio: 1.8 (ref 1.2–2.2)
Albumin: 4.6 g/dL (ref 3.8–4.9)
Alkaline Phosphatase: 63 IU/L (ref 44–121)
BUN/Creatinine Ratio: 16 (ref 10–24)
BUN: 18 mg/dL (ref 8–27)
Bilirubin Total: 0.6 mg/dL (ref 0.0–1.2)
CO2: 26 mmol/L (ref 20–29)
Calcium: 9.7 mg/dL (ref 8.6–10.2)
Chloride: 102 mmol/L (ref 96–106)
Creatinine, Ser: 1.11 mg/dL (ref 0.76–1.27)
Globulin, Total: 2.6 g/dL (ref 1.5–4.5)
Glucose: 98 mg/dL (ref 70–99)
Potassium: 4.7 mmol/L (ref 3.5–5.2)
Sodium: 142 mmol/L (ref 134–144)
Total Protein: 7.2 g/dL (ref 6.0–8.5)
eGFR: 76 mL/min/{1.73_m2} (ref >59)

## 2021-03-14 LAB — CBC WITH DIFFERENTIAL/PLATELET
Basophils Absolute: 0 10*3/uL (ref 0.0–0.2)
Basos: 1 %
EOS (ABSOLUTE): 0.2 10*3/uL (ref 0.0–0.4)
Eos: 3 %
Hematocrit: 46.7 % (ref 37.5–51.0)
Hemoglobin: 15.7 g/dL (ref 13.0–17.7)
Immature Grans (Abs): 0 10*3/uL (ref 0.0–0.1)
Immature Granulocytes: 0 %
Lymphocytes Absolute: 1.4 10*3/uL (ref 0.7–3.1)
Lymphs: 23 %
MCH: 29.8 pg (ref 26.6–33.0)
MCHC: 33.6 g/dL (ref 31.5–35.7)
MCV: 89 fL (ref 79–97)
Monocytes Absolute: 0.4 10*3/uL (ref 0.1–0.9)
Monocytes: 6 %
Neutrophils Absolute: 4.3 10*3/uL (ref 1.4–7.0)
Neutrophils: 67 %
Platelets: 192 10*3/uL (ref 150–450)
RBC: 5.27 x10E6/uL (ref 4.14–5.80)
RDW: 12.9 % (ref 11.6–15.4)
WBC: 6.3 10*3/uL (ref 3.4–10.8)

## 2021-03-14 LAB — D-DIMER, QUANTITATIVE: D-DIMER: 0.83 mg/L FEU — ABNORMAL HIGH (ref 0.00–0.49)

## 2021-03-14 NOTE — Addendum Note (Signed)
Addended by: Baruch Gouty on: 03/14/2021 10:03 AM   Modules accepted: Orders

## 2021-04-10 ENCOUNTER — Other Ambulatory Visit: Payer: Self-pay

## 2021-04-10 ENCOUNTER — Ambulatory Visit (INDEPENDENT_AMBULATORY_CARE_PROVIDER_SITE_OTHER): Payer: No Typology Code available for payment source | Admitting: Family

## 2021-04-10 ENCOUNTER — Encounter: Payer: Self-pay | Admitting: Family

## 2021-04-10 VITALS — BP 114/69 | HR 103 | Temp 98.2°F | Ht 67.0 in | Wt 186.0 lb

## 2021-04-10 DIAGNOSIS — Z1211 Encounter for screening for malignant neoplasm of colon: Secondary | ICD-10-CM

## 2021-04-10 DIAGNOSIS — Z114 Encounter for screening for human immunodeficiency virus [HIV]: Secondary | ICD-10-CM

## 2021-04-10 DIAGNOSIS — Z0000 Encounter for general adult medical examination without abnormal findings: Secondary | ICD-10-CM

## 2021-04-10 DIAGNOSIS — Z1159 Encounter for screening for other viral diseases: Secondary | ICD-10-CM | POA: Diagnosis not present

## 2021-04-10 NOTE — Patient Instructions (Signed)
Health Maintenance, Male Adopting a healthy lifestyle and getting preventive care are important in promoting health and wellness. Ask your health care provider about: The right schedule for you to have regular tests and exams. Things you can do on your own to prevent diseases and keep yourself healthy. What should I know about diet, weight, and exercise? Eat a healthy diet  Eat a diet that includes plenty of vegetables, fruits, low-fat dairy products, and lean protein. Do not eat a lot of foods that are high in solid fats, added sugars, or sodium. Maintain a healthy weight Body mass index (BMI) is a measurement that can be used to identify possible weight problems. It estimates body fat based on height and weight. Your health care provider can help determine your BMI and help you achieve or maintain a healthy weight. Get regular exercise Get regular exercise. This is one of the most important things you can do for your health. Most adults should: Exercise for at least 150 minutes each week. The exercise should increase your heart rate and make you sweat (moderate-intensity exercise). Do strengthening exercises at least twice a week. This is in addition to the moderate-intensity exercise. Spend less time sitting. Even light physical activity can be beneficial. Watch cholesterol and blood lipids Have your blood tested for lipids and cholesterol at 60 years of age, then have this test every 5 years. You may need to have your cholesterol levels checked more often if: Your lipid or cholesterol levels are high. You are older than 60 years of age. You are at high risk for heart disease. What should I know about cancer screening? Many types of cancers can be detected early and may often be prevented. Depending on your health history and family history, you may need to have cancer screening at various ages. This may include screening for: Colorectal cancer. Prostate cancer. Skin cancer. Lung  cancer. What should I know about heart disease, diabetes, and high blood pressure? Blood pressure and heart disease High blood pressure causes heart disease and increases the risk of stroke. This is more likely to develop in people who have high blood pressure readings, are of African descent, or are overweight. Talk with your health care provider about your target blood pressure readings. Have your blood pressure checked: Every 3-5 years if you are 18-39 years of age. Every year if you are 40 years old or older. If you are between the ages of 65 and 75 and are a current or former smoker, ask your health care provider if you should have a one-time screening for abdominal aortic aneurysm (AAA). Diabetes Have regular diabetes screenings. This checks your fasting blood sugar level. Have the screening done: Once every three years after age 45 if you are at a normal weight and have a low risk for diabetes. More often and at a younger age if you are overweight or have a high risk for diabetes. What should I know about preventing infection? Hepatitis B If you have a higher risk for hepatitis B, you should be screened for this virus. Talk with your health care provider to find out if you are at risk for hepatitis B infection. Hepatitis C Blood testing is recommended for: Everyone born from 1945 through 1965. Anyone with known risk factors for hepatitis C. Sexually transmitted infections (STIs) You should be screened each year for STIs, including gonorrhea and chlamydia, if: You are sexually active and are younger than 60 years of age. You are older than 60 years   of age and your health care provider tells you that you are at risk for this type of infection. Your sexual activity has changed since you were last screened, and you are at increased risk for chlamydia or gonorrhea. Ask your health care provider if you are at risk. Ask your health care provider about whether you are at high risk for HIV.  Your health care provider may recommend a prescription medicine to help prevent HIV infection. If you choose to take medicine to prevent HIV, you should first get tested for HIV. You should then be tested every 3 months for as long as you are taking the medicine. Follow these instructions at home: Lifestyle Do not use any products that contain nicotine or tobacco, such as cigarettes, e-cigarettes, and chewing tobacco. If you need help quitting, ask your health care provider. Do not use street drugs. Do not share needles. Ask your health care provider for help if you need support or information about quitting drugs. Alcohol use Do not drink alcohol if your health care provider tells you not to drink. If you drink alcohol: Limit how much you have to 0-2 drinks a day. Be aware of how much alcohol is in your drink. In the [place] one drink equals one 12 oz bottle of beer (355 mL), one 5 oz glass of wine (148 mL), or one 1 oz glass of hard liquor (44 mL). General instructions Schedule regular health, dental, and eye exams. Stay current with your vaccines. Tell your health care provider if: You often feel depressed. You have ever been abused or do not feel safe at home. Summary Adopting a healthy lifestyle and getting preventive care are important in promoting health and wellness. Follow your health care provider's instructions about healthy diet, exercising, and getting tested or screened for diseases. Follow your health care provider's instructions on monitoring your cholesterol and blood pressure. This information is not intended to replace advice given to you by your health care provider. Make sure you discuss any questions you have with your health care provider. Document Revised: 08/02/2020 Document Reviewed: 05/18/2018 Elsevier Patient Education  2022 Elsevier Inc.  

## 2021-04-10 NOTE — Progress Notes (Signed)
Subjective:    Patient ID: Jay Shaw, male    DOB: Nov 25, 1960, 60 y.o.   MRN: [id number]  Chief Complaint  Patient presents with   Annual Exam    HPI PT presents to the office today for CPE. PT currently not taking any medications. Pt denies any headache, palpitations, SOB, or edema at this time.    Review of Systems  All other systems reviewed and are negative.  Family History  Problem Relation Age of Onset   Cancer Father    COPD Father    Heart disease Father    Social History   Socioeconomic History   Marital status: Married    Spouse name: Not on file   Number of children: Not on file   Years of education: Not on file   Highest education level: Not on file  Occupational History   Not on file  Tobacco Use   Smoking status: Never   Smokeless tobacco: Never  Vaping Use   Vaping Use: Never used  Substance and Sexual Activity   Alcohol use: No   Drug use: No   Sexual activity: Not on file  Other Topics Concern   Not on file  Social History Narrative   Not on file   Social Determinants of Health   Financial Resource Strain: Not on file  Food Insecurity: Not on file  Transportation Needs: Not on file  Physical Activity: Not on file  Stress: Not on file  Social Connections: Not on file       Objective:   Physical Exam Vitals reviewed.  Constitutional:      General: He is not in acute distress.    Appearance: He is well-developed.  HENT:     Head: Normocephalic.     Right Ear: Tympanic membrane normal.     Left Ear: Tympanic membrane normal.  Eyes:     General:        Right eye: No discharge.        Left eye: No discharge.     Pupils: Pupils are equal, round, and reactive to light.  Neck:     Thyroid: No thyromegaly.  Cardiovascular:     Rate and Rhythm: Normal rate and regular rhythm.     Heart sounds: Normal heart sounds. No murmur heard. Pulmonary:     Effort: Pulmonary effort is normal. No respiratory distress.     Breath sounds:  Normal breath sounds. No wheezing.  Abdominal:     General: Bowel sounds are normal. There is no distension.     Palpations: Abdomen is soft.     Tenderness: There is no abdominal tenderness.  Musculoskeletal:        General: No tenderness. Normal range of motion.     Cervical back: Normal range of motion and neck supple.  Skin:    General: Skin is warm and dry.     Findings: No erythema or rash.  Neurological:     Mental Status: He is alert and oriented to person, place, and time.     Cranial Nerves: No cranial nerve deficit.     Deep Tendon Reflexes: Reflexes are normal and symmetric.  Psychiatric:        Behavior: Behavior normal.        Thought Content: Thought content normal.        Judgment: Judgment normal.      BP 114/69   Pulse (!) 103   Temp 98.2 F (36.8 C) (Temporal)   Ht 5'  7" (1.702 m)   Wt 186 lb (84.4 kg)   BMI 29.13 kg/m      Assessment & Plan:  [name] comes in today with chief complaint of Annual Exam   Diagnosis and orders addressed:  1. Annual physical exam - Ambulatory referral to Gastroenterology - Lipid panel - PSA, total and free - TSH - HIV Antibody (routine testing w rflx) - Hepatitis C antibody  2. Colon cancer screening - Ambulatory referral to Gastroenterology  3. Need for hepatitis C screening test  - Hepatitis C antibody  4. Encounter for screening for HIV - HIV Antibody (routine testing w rflx)   Labs pending Health Maintenance reviewed Diet and exercise encouraged  Follow up plan: 1 year    Evelina Dun, FNP

## 2021-04-11 LAB — TSH: TSH: 6.46 u[IU]/mL — ABNORMAL HIGH (ref 0.450–4.500)

## 2021-04-11 LAB — LIPID PANEL
Chol/HDL Ratio: 4 ratio (ref 0.0–5.0)
Cholesterol, Total: 182 mg/dL (ref 100–199)
HDL: 45 mg/dL (ref >39)
LDL Chol Calc (NIH): 99 mg/dL (ref 0–99)
Triglycerides: 226 mg/dL — ABNORMAL HIGH (ref 0–149)
VLDL Cholesterol Cal: 38 mg/dL (ref 5–40)

## 2021-04-11 LAB — HEPATITIS C ANTIBODY: Hep C Virus Ab: <0.1 s/co ratio (ref 0.0–0.9)

## 2021-04-11 LAB — HIV ANTIBODY (ROUTINE TESTING W REFLEX): HIV Screen 4th Generation wRfx: NONREACTIVE

## 2021-04-11 LAB — PSA, TOTAL AND FREE
PSA, Free Pct: 17.3 %
PSA, Free: 0.26 ng/mL
Prostate Specific Ag, Serum: 1.5 ng/mL (ref 0.0–4.0)

## 2021-04-14 ENCOUNTER — Other Ambulatory Visit: Payer: Self-pay | Admitting: Family

## 2021-04-14 ENCOUNTER — Encounter: Payer: Self-pay | Admitting: Internal Medicine

## 2021-04-14 MED ORDER — ROSUVASTATIN CALCIUM 5 MG PO TABS: 5.0000 mg | ORAL_TABLET | Freq: Every day | ORAL | 3 refills | Status: DC

## 2021-04-14 MED ORDER — LEVOTHYROXINE SODIUM 50 MCG PO TABS: 50.0000 ug | ORAL_TABLET | Freq: Every day | ORAL | 2 refills | Status: DC

## 2021-05-12 ENCOUNTER — Ambulatory Visit: Payer: No Typology Code available for payment source

## 2021-05-15 ENCOUNTER — Ambulatory Visit: Payer: No Typology Code available for payment source

## 2021-05-16 ENCOUNTER — Ambulatory Visit: Payer: No Typology Code available for payment source | Admitting: Nurse Practitioner

## 2021-05-16 DIAGNOSIS — J40 Bronchitis, not specified as acute or chronic: Secondary | ICD-10-CM

## 2021-05-16 MED ORDER — PREDNISONE 20 MG PO TABS: 40.0000 mg | ORAL_TABLET | Freq: Every day | ORAL | 0 refills | Status: AC

## 2021-05-16 MED ORDER — BENZONATATE 100 MG PO CAPS: 100.0000 mg | ORAL_CAPSULE | Freq: Three times a day (TID) | ORAL | 0 refills | Status: DC | PRN

## 2021-05-16 NOTE — Patient Instructions (Signed)

## 2021-05-16 NOTE — Progress Notes (Signed)
Virtual Visit  Note Due to COVID-19 pandemic this visit was conducted virtually. This visit type was conducted due to national recommendations for restrictions regarding the COVID-19 Pandemic (e.g. social distancing, sheltering in place) in an effort to limit this patient's exposure and mitigate transmission in our community. All issues noted in this document were discussed and addressed.  A physical exam was not performed with this format.  I connected with Jay Shaw on 05/16/21 at 1:40 by telephone and verified that I am speaking with the correct person using two identifiers. Jay Shaw is currently located at home and his wife is currently with him during visit. The provider, Mary-Margaret Hassell Done, FNP is located in their office at time of visit.  I discussed the limitations, risks, security and privacy concerns of performing an evaluation and management service by telephone and the availability of in person appointments. I also discussed with the patient that there may be a patient responsible charge related to this service. The patient expressed understanding and agreed to proceed.   History and Present Illness:  Patient states hat he developed a sore thorat last Thursday. Had dizzinees, with nause. The cough has gotten worse and he cannot get rid of it. Cough is worse at night when he lays down. Covid test was negative.     Review of Systems  Constitutional:  Positive for malaise/fatigue. Negative for chills and fever.  HENT:  Positive for congestion and sore throat (slight).   Respiratory:  Positive for cough and sputum production (occasional). Negative for shortness of breath.   Musculoskeletal:  Negative for myalgias.  Neurological:  Negative for dizziness and headaches.    Observations/Objective: Alert and oriented- answers all questions appropriately No distress Raspy voice Dry deep cough noted during visit.  Assessment and Plan: Vartan Pflug in today with chief  complaint of No chief complaint on file.   1. Take meds as prescribed 2. Use a cool mist humidifier especially during the winter months and when heat has been humid. 3. Use saline nose sprays frequently 4. Saline irrigations of the nose can be very helpful if done frequently.  * 4X daily for 1 week*  * Use of a nettie pot can be helpful with this. Follow directions with this* 5. Drink plenty of fluids 6. Keep thermostat turn down low 7.For any cough or congestion- tessalon perles 8. For fever or aces or pains- take tylenol or ibuprofen appropriate for age and weight.  * for fevers greater than 101 orally you may alternate ibuprofen and tylenol every  3 hours.    Meds ordered this encounter  Medications   predniSONE (DELTASONE) 20 MG tablet    Sig: Take 2 tablets (40 mg total) by mouth daily with breakfast for 5 days. 2 po daily for 5 days    Dispense:  10 tablet    Refill:  0    Order Specific Question:   Supervising Provider    Answer:   Caryl Pina A [[id number]]   benzonatate (TESSALON PERLES) 100 MG capsule    Sig: Take 1 capsule (100 mg total) by mouth 3 (three) times daily as needed for cough.    Dispense:  20 capsule    Refill:  0    Order Specific Question:   Supervising Provider    Answer:   Caryl Pina A [[id number]]        Follow Up Instructions: prn    I discussed the assessment and treatment plan with the patient. The patient was provided  an opportunity to ask questions and all were answered. The patient agreed with the plan and demonstrated an understanding of the instructions.   The patient was advised to call back or seek an in-person evaluation if the symptoms worsen or if the condition fails to improve as anticipated.  The above assessment and management plan was discussed with the patient. The patient verbalized understanding of and has agreed to the management plan. Patient is aware to call the clinic if symptoms persist or worsen. Patient is  aware when to return to the clinic for a follow-up visit. Patient educated on when it is appropriate to go to the emergency department.   Time call ended:  1:53  I provided 12 minutes of  non face-to-face time during this encounter.    Mary-Margaret Hassell Done, FNP

## 2021-06-16 ENCOUNTER — Ambulatory Visit: Payer: No Typology Code available for payment source | Admitting: Family

## 2021-06-18 ENCOUNTER — Other Ambulatory Visit: Payer: Self-pay

## 2021-06-18 ENCOUNTER — Ambulatory Visit (INDEPENDENT_AMBULATORY_CARE_PROVIDER_SITE_OTHER): Payer: Self-pay | Admitting: *Deleted

## 2021-06-18 VITALS — Ht 67.0 in | Wt 180.0 lb

## 2021-06-18 DIAGNOSIS — Z1211 Encounter for screening for malignant neoplasm of colon: Secondary | ICD-10-CM

## 2021-06-18 NOTE — Progress Notes (Addendum)
Gastroenterology Pre-Procedure Review  Request Date: 06/18/2021 Requesting Physician: Evelina Dun, FNP @ [facility], no previous TCS  PATIENT REVIEW QUESTIONS: The patient responded to the following health history questions as indicated:    1. Diabetes Melitis: no 2. Joint replacements in the past 12 months: no 3. Major health problems in the past 3 months: no 4. Has an artificial valve or MVP: no 5. Has a defibrillator: no 6. Has been advised in past to take antibiotics in advance of a procedure like teeth cleaning: no 7. Family history of colon cancer: no  8. Alcohol Use: yes, 2 glasses of wine and a beer weekly 9. Illicit drug Use: no 10. History of sleep apnea: no 11. History of coronary artery or other vascular stents placed within the last 12 months: no 12. History of any prior anesthesia complications: no 13. Body mass index is 28.19 kg/m.    MEDICATIONS & ALLERGIES:    Patient reports the following regarding taking any blood thinners:   Plavix? no Aspirin? no Coumadin? no Brilinta? no Xarelto? no Eliquis? no Pradaxa? no Savaysa? no Effient? no  Patient confirms/reports the following medications:  Current Outpatient Medications  Medication Sig Dispense Refill   ascorbic acid (VITAMIN C) 250 MG CHEW Chew 250 mg by mouth daily. Chews 2 daily.     levothyroxine (SYNTHROID) 50 MCG tablet Take 1 tablet (50 mcg total) by mouth daily. 90 tablet 2   No current facility-administered medications for this visit.    Patient confirms/reports the following allergies:  No Known Allergies  No orders of the defined types were placed in this encounter.   AUTHORIZATION INFORMATION Primary Insurance: [place],  [place] #: [id number],  Group #: [id number] Pre-Cert / Josem Kaufmann required: No, not required  SCHEDULE INFORMATION: Procedure has been scheduled as follows:  Date: 07/04/2021, Time: 11:00 Location: [facility] with Dr. Abbey Chatters  This Gastroenterology  Pre-Precedure Review Form is being routed to the following provider(s): Aliene Altes, PA-C

## 2021-06-19 NOTE — Progress Notes (Signed)
OK to schedule. ASA II 

## 2021-06-20 ENCOUNTER — Encounter: Payer: Self-pay | Admitting: *Deleted

## 2021-06-20 MED ORDER — NA SULFATE-K SULFATE-MG SULF 17.5-3.13-1.6 GM/177ML PO SOLN: 1.0000 | Freq: Once | ORAL | 0 refills | Status: AC

## 2021-06-20 NOTE — Progress Notes (Signed)
Spoke to pt.  He scheduled procedure for 07/04/2021 with arrival at 9:30.  Reviewed prep instructions by telephone.  Pt made aware to pick up his prep kit at pharmacy. Pt requested me to email the instructions to [email or web address]

## 2021-06-20 NOTE — Addendum Note (Signed)
Addended by: Metro Kung on: 06/20/2021 09:27 AM   Modules accepted: Orders

## 2021-06-27 ENCOUNTER — Ambulatory Visit: Payer: No Typology Code available for payment source | Admitting: Family

## 2021-06-30 ENCOUNTER — Encounter: Payer: Self-pay | Admitting: Family

## 2021-06-30 ENCOUNTER — Ambulatory Visit: Payer: No Typology Code available for payment source | Admitting: Family

## 2021-06-30 VITALS — BP 112/70 | HR 71 | Temp 97.3°F | Ht 67.0 in | Wt 186.0 lb

## 2021-06-30 DIAGNOSIS — H6123 Impacted cerumen, bilateral: Secondary | ICD-10-CM

## 2021-06-30 DIAGNOSIS — Z23 Encounter for immunization: Secondary | ICD-10-CM | POA: Diagnosis not present

## 2021-06-30 DIAGNOSIS — E039 Hypothyroidism, unspecified: Secondary | ICD-10-CM

## 2021-06-30 MED ORDER — LEVOTHYROXINE SODIUM 50 MCG PO TABS: 50.0000 ug | ORAL_TABLET | Freq: Every day | ORAL | 2 refills | Status: DC

## 2021-06-30 NOTE — Progress Notes (Signed)
Subjective:    Patient ID: Jay Shaw, male    DOB: 17-Sep-1960, 61 y.o.   MRN: [id number]  Chief Complaint  Patient presents with   Hypothyroidism   Pt presents to the office today to recheck thyroid. He was seen on 04/10/21 and started on levothyroxine 50 mcg. He has his coloscopy scheduled for Friday.  Thyroid Problem Presents for follow-up visit. Patient reports no anxiety, diaphoresis, dry skin, fatigue or heat intolerance. The symptoms have been stable.  Ear Fullness  There is pain in both ears. The current episode started 1 to 4 weeks ago. The problem occurs constantly. There has been no fever.     Review of Systems  Constitutional:  Negative for diaphoresis and fatigue.  Endocrine: Negative for heat intolerance.  Psychiatric/Behavioral:  The patient is not nervous/anxious.   All other systems reviewed and are negative.     Objective:   Physical Exam Vitals reviewed.  Constitutional:      General: He is not in acute distress.    Appearance: He is well-developed.  HENT:     Head: Normocephalic.     Right Ear: There is impacted cerumen.     Left Ear: There is impacted cerumen.  Eyes:     General:        Right eye: No discharge.        Left eye: No discharge.     Pupils: Pupils are equal, round, and reactive to light.  Neck:     Thyroid: No thyromegaly.  Cardiovascular:     Rate and Rhythm: Normal rate and regular rhythm.     Heart sounds: Normal heart sounds. No murmur heard. Pulmonary:     Effort: Pulmonary effort is normal. No respiratory distress.     Breath sounds: Normal breath sounds. No wheezing.  Abdominal:     General: Bowel sounds are normal. There is no distension.     Palpations: Abdomen is soft.     Tenderness: There is no abdominal tenderness.  Musculoskeletal:        General: No tenderness. Normal range of motion.     Cervical back: Normal range of motion and neck supple.  Skin:    General: Skin is warm and dry.     Findings: No erythema  or rash.  Neurological:     Mental Status: He is alert and oriented to person, place, and time.     Cranial Nerves: No cranial nerve deficit.     Deep Tendon Reflexes: Reflexes are normal and symmetric.  Psychiatric:        Behavior: Behavior normal.        Thought Content: Thought content normal.        Judgment: Judgment normal.   Bilateral ears washed with warm water and peroxide. TM of right WNL, left large amount of cerumen removed, but unable to tolerate more removal.   BP 112/70    Pulse 71    Temp (!) 97.3 F (36.3 C) (Temporal)    Ht '5\' 7"'  (1.702 m)    Wt 186 lb (84.4 kg)    BMI 29.13 kg/m      Assessment & Plan:  Jay Shaw comes in today with chief complaint of Hypothyroidism   Diagnosis and orders addressed:  1. Hypothyroidism, unspecified type - BMP8+EGFR - TSH - levothyroxine (SYNTHROID) 50 MCG tablet; Take 1 tablet (50 mcg total) by mouth daily.  Dispense: 90 tablet; Refill: 2  2. Need for shingles vaccine - BMP8+EGFR - TSH  3. Bilateral impacted cerumen Use OTC drops as needed   Labs pending Health Maintenance reviewed Diet and exercise encouraged  Follow up plan: 1 year if TSH is normal    Evelina Dun, FNP

## 2021-06-30 NOTE — Patient Instructions (Signed)

## 2021-07-01 LAB — BMP8+EGFR
BUN/Creatinine Ratio: 15 (ref 10–24)
BUN: 15 mg/dL (ref 8–27)
CO2: 27 mmol/L (ref 20–29)
Calcium: 9.4 mg/dL (ref 8.6–10.2)
Chloride: 102 mmol/L (ref 96–106)
Creatinine, Ser: 0.98 mg/dL (ref 0.76–1.27)
Glucose: 96 mg/dL (ref 70–99)
Potassium: 5 mmol/L (ref 3.5–5.2)
Sodium: 141 mmol/L (ref 134–144)
eGFR: 88 mL/min/{1.73_m2} (ref >59)

## 2021-07-01 LAB — TSH: TSH: 2.88 u[IU]/mL (ref 0.450–4.500)

## 2021-07-04 ENCOUNTER — Encounter (HOSPITAL_COMMUNITY): Payer: Self-pay

## 2021-07-04 ENCOUNTER — Encounter (HOSPITAL_COMMUNITY)
Admission: RE | Disposition: A | Discharge status: Home / Self Care | Payer: Self-pay | Source: Home / Self Care | Attending: Internal Medicine

## 2021-07-04 ENCOUNTER — Ambulatory Visit (HOSPITAL_COMMUNITY): Payer: No Typology Code available for payment source | Admitting: Certified Registered Nurse Anesthetist

## 2021-07-04 ENCOUNTER — Other Ambulatory Visit: Payer: Self-pay

## 2021-07-04 ENCOUNTER — Ambulatory Visit (HOSPITAL_COMMUNITY)
Admission: RE | Admit: 2021-07-04 | Discharge: 2021-07-04 | Disposition: A | Discharge status: Home / Self Care | Payer: No Typology Code available for payment source | Attending: Internal Medicine | Admitting: Internal Medicine

## 2021-07-04 DIAGNOSIS — Z1211 Encounter for screening for malignant neoplasm of colon: Secondary | ICD-10-CM

## 2021-07-04 DIAGNOSIS — D123 Benign neoplasm of transverse colon: Secondary | ICD-10-CM | POA: Diagnosis not present

## 2021-07-04 DIAGNOSIS — K635 Polyp of colon: Secondary | ICD-10-CM | POA: Diagnosis not present

## 2021-07-04 HISTORY — PX: POLYPECTOMY: SHX5525

## 2021-07-04 HISTORY — PX: COLONOSCOPY WITH PROPOFOL: SHX5780

## 2021-07-04 SURGERY — COLONOSCOPY WITH PROPOFOL
Anesthesia: General

## 2021-07-04 MED ORDER — LIDOCAINE HCL (CARDIAC) PF 100 MG/5ML IV SOSY
PREFILLED_SYRINGE | INTRAVENOUS | Status: DC | PRN
  Administered 2021-07-04: 50 mg via INTRAVENOUS

## 2021-07-04 MED ORDER — LACTATED RINGERS IV SOLN: INTRAVENOUS | Status: DC

## 2021-07-04 MED ORDER — PROPOFOL 10 MG/ML IV BOLUS
INTRAVENOUS | Status: DC | PRN
  Administered 2021-07-04: 100 mg via INTRAVENOUS

## 2021-07-04 MED ORDER — PHENYLEPHRINE HCL (PRESSORS) 10 MG/ML IV SOLN
INTRAVENOUS | Status: DC | PRN
Start: 2021-07-04 — End: 2021-07-04
  Administered 2021-07-04 (×2): 80 ug via INTRAVENOUS

## 2021-07-04 MED ORDER — STERILE WATER FOR IRRIGATION IR SOLN
Status: DC | PRN
  Administered 2021-07-04: 60 mL

## 2021-07-04 MED ORDER — PROPOFOL 500 MG/50ML IV EMUL
INTRAVENOUS | Status: DC | PRN
  Administered 2021-07-04: 200 ug/kg/min via INTRAVENOUS

## 2021-07-04 NOTE — Discharge Instructions (Addendum)
°  Colonoscopy Discharge Instructions  Read the instructions outlined below and refer to this sheet in the next few weeks. These discharge instructions provide you with general information on caring for yourself after you leave the hospital. Your doctor may also give you specific instructions. While your treatment has been planned according to the most current medical practices available, unavoidable complications occasionally occur.   ACTIVITY You may resume your regular activity, but move at a slower pace for the next 24 hours.  Take frequent rest periods for the next 24 hours.  Walking will help get rid of the air and reduce the bloated feeling in your belly (abdomen).  No driving for 24 hours (because of the medicine (anesthesia) used during the test).   Do not sign any important legal documents or operate any machinery for 24 hours (because of the anesthesia used during the test).  NUTRITION Drink plenty of fluids.  You may resume your normal diet as instructed by your doctor.  Begin with a light meal and progress to your normal diet. Heavy or fried foods are harder to digest and may make you feel sick to your stomach (nauseated).  Avoid alcoholic beverages for 24 hours or as instructed.  MEDICATIONS You may resume your normal medications unless your doctor tells you otherwise.  WHAT YOU CAN EXPECT TODAY Some feelings of bloating in the abdomen.  Passage of more gas than usual.  Spotting of blood in your stool or on the toilet paper.  IF YOU HAD POLYPS REMOVED DURING THE COLONOSCOPY: No aspirin products for 7 days or as instructed.  No alcohol for 7 days or as instructed.  Eat a soft diet for the next 24 hours.  FINDING OUT THE RESULTS OF YOUR TEST Not all test results are available during your visit. If your test results are not back during the visit, make an appointment with your caregiver to find out the results. Do not assume everything is normal if you have not heard from your  caregiver or the medical facility. It is important for you to follow up on all of your test results.  SEEK IMMEDIATE MEDICAL ATTENTION IF: You have more than a spotting of blood in your stool.  Your belly is swollen (abdominal distention).  You are nauseated or vomiting.  You have a temperature over 101.  You have abdominal pain or discomfort that is severe or gets worse throughout the day.   Your colonoscopy revealed 1 polyp(s) which I removed successfully. Await pathology results, my office will contact you. I recommend repeating colonoscopy in 5 years for surveillance purposes.   You also have diverticulosis and internal hemorrhoids. I would recommend increasing fiber in your diet or adding OTC Benefiber/Metamucil. Be sure to drink at least 4 to 6 glasses of water daily. Follow-up with GI as needed.   I hope you have a great rest of your week!  Charles K. Carver, D.O. Gastroenterology and Hepatology [facility]  

## 2021-07-04 NOTE — Anesthesia Postprocedure Evaluation (Signed)
Anesthesia Post Note  Patient: Jay Shaw  Procedure(s) Performed: COLONOSCOPY WITH PROPOFOL POLYPECTOMY  Patient location during evaluation: Phase II Anesthesia Type: General Level of consciousness: awake Pain management: pain level controlled Vital Signs Assessment: post-procedure vital signs reviewed and stable Respiratory status: spontaneous breathing and respiratory function stable Cardiovascular status: blood pressure returned to baseline and stable Postop Assessment: no headache and no apparent nausea or vomiting Anesthetic complications: no Comments: Late entry   No notable events documented.   Last Vitals:  Vitals:   07/04/21 1134 07/04/21 1136  BP: (!) 87/62 90/61  Pulse: 74 70  Resp: 20 18  Temp:    SpO2: 97% 98%    Last Pain:  Vitals:   07/04/21 1134  TempSrc:   PainSc: 0-No pain                 Louann Sjogren

## 2021-07-04 NOTE — H&P (Signed)
Primary Care Physician:  Sharion Balloon, FNP Primary Gastroenterologist:  Dr. Abbey Chatters  Pre-Procedure History & Physical: HPI:  Jay Shaw is a 61 y.o. male is here for first ever colonoscopy for colon cancer screening purposes.  Patient denies any family history of colorectal cancer.  No melena or hematochezia.  No abdominal pain or unintentional weight loss.  No change in bowel habits.  Overall feels well from a GI standpoint.  Past Medical History:  Diagnosis Date   Hx of blood clots    blood clot lower extrmity    Past Surgical History:  Procedure Laterality Date   ACHILLES TENDON REPAIR Left    EYE SURGERY     HERNIA REPAIR     KNEE SURGERY Right    ROTATOR CUFF REPAIR Right     Prior to Admission medications   Medication Sig Start Date End Date Taking? Authorizing Provider  ascorbic acid (VITAMIN C) 250 MG CHEW Chew 500 mg by mouth daily. Chews 2 daily.   Yes [provider]  levothyroxine (SYNTHROID) 50 MCG tablet Take 1 tablet (50 mcg total) by mouth daily. 06/30/21 06/30/22  Sharion Balloon, FNP    Allergies as of 06/20/2021   (No Known Allergies)    Family History  Problem Relation Age of Onset   Cancer Father    COPD Father    Heart disease Father     Social History   Socioeconomic History   Marital status: Married    Spouse name: Not on file   Number of children: Not on file   Years of education: Not on file   Highest education level: Not on file  Occupational History   Not on file  Tobacco Use   Smoking status: Never   Smokeless tobacco: Never  Vaping Use   Vaping Use: Never used  Substance and Sexual Activity   Alcohol use: No   Drug use: No   Sexual activity: Not on file  Other Topics Concern   Not on file  Social History Narrative   Not on file   Social Determinants of Health   Financial Resource Strain: Not on file  Food Insecurity: Not on file  Transportation Needs: Not on file  Physical Activity: Not on file  Stress:  Not on file  Social Connections: Not on file  Intimate Partner Violence: Not on file    Review of Systems: See HPI, otherwise negative ROS  Physical Exam: Vital signs in last 24 hours: Temp:  [98.4 F (36.9 C)] 98.4 F (36.9 C) (01/27 1001) Pulse Rate:  [84] 84 (01/27 1001) Resp:  [14] 14 (01/27 1001) BP: (115)/(72) 115/72 (01/27 1001) SpO2:  [98 %] 98 % (01/27 1001) Weight:  [81.2 kg] 81.2 kg (01/27 1001)   General:   Alert,  Well-developed, well-nourished, pleasant and cooperative in NAD Head:  Normocephalic and atraumatic. Eyes:  Sclera clear, no icterus.   Conjunctiva pink. Ears:  Normal auditory acuity. Nose:  No deformity, discharge,  or lesions. Mouth:  No deformity or lesions, dentition normal. Neck:  Supple; no masses or thyromegaly. Lungs:  Clear throughout to auscultation.   No wheezes, crackles, or rhonchi. No acute distress. Heart:  Regular rate and rhythm; no murmurs, clicks, rubs,  or gallops. Abdomen:  Soft, nontender and nondistended. No masses, hepatosplenomegaly or hernias noted. Normal bowel sounds, without guarding, and without rebound.   Msk:  Symmetrical without gross deformities. Normal posture. Extremities:  Without clubbing or edema. Neurologic:  Alert and  oriented x4;  grossly normal neurologically. Skin:  Intact without significant lesions or rashes. Cervical Nodes:  No significant cervical adenopathy. Psych:  Alert and cooperative. Normal mood and affect.  Impression/Plan: Jay Shaw is here for a colonoscopy to be performed for colon cancer screening purposes.  The risks of the procedure including infection, bleed, or perforation as well as benefits, limitations, alternatives and imponderables have been reviewed with the patient. Questions have been answered. All parties agreeable.

## 2021-07-04 NOTE — Op Note (Signed)
[facility] Patient Name: Jay Shaw Procedure Date: 07/04/2021 11:04 AM MRN: [id number] Date of Birth: May 17, 1961 Attending MD: Elon Alas. Abbey Chatters DO CSN: [id number] Age: 61 Admit Type: Outpatient Procedure:                Colonoscopy Indications:              Screening for colorectal malignant neoplasm Providers:                Elon Alas. Abbey Chatters, DO, Lambert Mody, Hughie Closs RN, RN, Randa Spike, Technician Referring MD:              Medicines:                See the Anesthesia note for documentation of the                            administered medications Complications:            No immediate complications. Estimated Blood Loss:     Estimated blood loss was minimal. Procedure:                Pre-Anesthesia Assessment:                           - The anesthesia plan was to use monitored                            anesthesia care (MAC).                           After obtaining informed consent, the colonoscope                            was passed under direct vision. Throughout the                            procedure, the patient's blood pressure, pulse, and                            oxygen saturations were monitored continuously. The                            [id number]-HQ190L ([id number]) scope was introduced through                            the anus and advanced to the the cecum, identified                            by appendiceal orifice and ileocecal valve. The                            colonoscopy was performed without difficulty. The                            patient tolerated the  procedure well. The quality                            of the bowel preparation was evaluated using the                            BBPS [facility] Bowel Preparation Scale) with scores                            of: Right Colon = 3, Transverse Colon = 3 and Left                            Colon = 3 (entire mucosa seen well with no residual                             staining, small fragments of stool or opaque                            liquid). The total BBPS score equals 9. Scope In: 11:16:02 AM Scope Out: 11:25:28 AM Scope Withdrawal Time: 0 hours 7 minutes 48 seconds  Total Procedure Duration: 0 hours 9 minutes 26 seconds  Findings:      The perianal and digital rectal examinations were normal.      Non-bleeding internal hemorrhoids were found during endoscopy.      Multiple medium-mouthed diverticula were found in the sigmoid colon.      A 3 mm polyp was found in the transverse colon. The polyp was sessile.       The polyp was removed with a cold snare. Resection and retrieval were       complete.      The exam was otherwise without abnormality. Impression:               - Non-bleeding internal hemorrhoids.                           - Diverticulosis in the sigmoid colon.                           - One 3 mm polyp in the transverse colon, removed                            with a cold snare. Resected and retrieved.                           - The examination was otherwise normal. Moderate Sedation:      Per Anesthesia Care Recommendation:           - Patient has a contact number available for                            emergencies. The signs and symptoms of potential                            delayed complications were discussed with the  patient. Return to normal activities tomorrow.                            Written discharge instructions were provided to the                            patient.                           - Resume previous diet.                           - Continue present medications.                           - Await pathology results.                           - Repeat colonoscopy in 5 years for surveillance.                           - Return to GI clinic PRN. Procedure Code(s):        --- Professional ---                           [phone], Colonoscopy, flexible; with removal of                             tumor(s), polyp(s), or other lesion(s) by snare                            technique Diagnosis Code(s):        --- Professional ---                           Z12.11, Encounter for screening for malignant                            neoplasm of colon                           K64.8, Other hemorrhoids                           K63.5, Polyp of colon                           K57.30, Diverticulosis of large intestine without                            perforation or abscess without bleeding CPT copyright 2019 American Medical Association. All rights reserved. The codes documented in this report are preliminary and upon coder review may  be revised to meet current compliance requirements. Elon Alas. Abbey Chatters, DO [place] Abbey Chatters, DO 07/04/2021 11:27:29 AM This report has been signed electronically. Number of Addenda: 0

## 2021-07-04 NOTE — Transfer of Care (Signed)
Immediate Anesthesia Transfer of Care Note  Patient: Jay Shaw  Procedure(s) Performed: COLONOSCOPY WITH PROPOFOL POLYPECTOMY  Patient Location: Endoscopy Unit  Anesthesia Type:General  Level of Consciousness: drowsy  Airway & Oxygen Therapy: Patient Spontanous Breathing  Post-op Assessment: Report given to RN and Post -op Vital signs reviewed and stable  Post vital signs: Reviewed and stable  Last Vitals:  Vitals Value Taken Time  BP    Temp    Pulse    Resp    SpO2      Last Pain:  Vitals:   07/04/21 1114  TempSrc:   PainSc: 0-No pain      Patients Stated Pain Goal: 6 (09/47/09 6283)  Complications: No notable events documented.

## 2021-07-04 NOTE — Anesthesia Preprocedure Evaluation (Signed)

## 2021-07-07 LAB — SURGICAL PATHOLOGY

## 2021-07-08 ENCOUNTER — Encounter (HOSPITAL_COMMUNITY): Payer: Self-pay | Admitting: Internal Medicine

## 2021-12-22 ENCOUNTER — Ambulatory Visit: Payer: No Typology Code available for payment source | Admitting: Family

## 2021-12-22 ENCOUNTER — Encounter: Payer: Self-pay | Admitting: Family

## 2021-12-22 VITALS — BP 100/60 | HR 82 | Temp 97.8°F | Ht 67.0 in | Wt 184.6 lb

## 2021-12-22 DIAGNOSIS — Z0000 Encounter for general adult medical examination without abnormal findings: Secondary | ICD-10-CM

## 2021-12-22 DIAGNOSIS — Z23 Encounter for immunization: Secondary | ICD-10-CM | POA: Diagnosis not present

## 2021-12-22 DIAGNOSIS — Z0001 Encounter for general adult medical examination with abnormal findings: Secondary | ICD-10-CM | POA: Diagnosis not present

## 2021-12-22 DIAGNOSIS — H6122 Impacted cerumen, left ear: Secondary | ICD-10-CM | POA: Diagnosis not present

## 2021-12-22 DIAGNOSIS — E039 Hypothyroidism, unspecified: Secondary | ICD-10-CM | POA: Diagnosis not present

## 2021-12-22 NOTE — Progress Notes (Signed)
Subjective:    Patient ID: Jay Shaw, male    DOB: Apr 06, 1961, 61 y.o.   MRN: [id number]  Chief Complaint  Patient presents with   Medical Management of Chronic Issues    PT presents to the office today for CPE. Thyroid Problem Presents for follow-up visit. Patient reports no constipation, diarrhea, fatigue or hoarse voice. The symptoms have been stable.      Review of Systems  Constitutional:  Negative for fatigue.  HENT:  Negative for hoarse voice.   Gastrointestinal:  Negative for constipation and diarrhea.  All other systems reviewed and are negative.  Family History  Problem Relation Age of Onset   Cancer Father    COPD Father    Heart disease Father    Social History   Socioeconomic History   Marital status: Married    Spouse name: Not on file   Number of children: Not on file   Years of education: Not on file   Highest education level: Not on file  Occupational History   Not on file  Tobacco Use   Smoking status: Never   Smokeless tobacco: Never  Vaping Use   Vaping Use: Never used  Substance and Sexual Activity   Alcohol use: No   Drug use: No   Sexual activity: Not on file  Other Topics Concern   Not on file  Social History Narrative   Not on file   Social Determinants of Health   Financial Resource Strain: Not on file  Food Insecurity: Not on file  Transportation Needs: Not on file  Physical Activity: Not on file  Stress: Not on file  Social Connections: Not on file        Objective:   Physical Exam Vitals reviewed.  Constitutional:      General: He is not in acute distress.    Appearance: He is well-developed. He is obese.  HENT:     Head: Normocephalic.     Right Ear: Tympanic membrane normal.     Left Ear: There is impacted cerumen.  Eyes:     General:        Right eye: No discharge.        Left eye: No discharge.     Pupils: Pupils are equal, round, and reactive to light.  Neck:     Thyroid: No thyromegaly.   Cardiovascular:     Rate and Rhythm: Normal rate and regular rhythm.     Heart sounds: Normal heart sounds. No murmur heard. Pulmonary:     Effort: Pulmonary effort is normal. No respiratory distress.     Breath sounds: Normal breath sounds. No wheezing.  Abdominal:     General: Bowel sounds are normal. There is no distension.     Palpations: Abdomen is soft.     Tenderness: There is no abdominal tenderness.  Musculoskeletal:        General: No tenderness. Normal range of motion.     Cervical back: Normal range of motion and neck supple.  Skin:    General: Skin is warm and dry.     Findings: No erythema or rash.  Neurological:     Mental Status: He is alert and oriented to person, place, and time.     Cranial Nerves: No cranial nerve deficit.     Deep Tendon Reflexes: Reflexes are normal and symmetric.  Psychiatric:        Behavior: Behavior normal.        Thought Content: Thought content normal.  Judgment: Judgment normal.     Left ear washed with warm water and peroxide. TM WNL. PT tolerated well.   BP 100/60   Pulse 82   Temp 97.8 F (36.6 C)   Ht '5\' 7"'  (1.702 m)   Wt 184 lb 9.6 oz (83.7 kg)   SpO2 97%   BMI 28.91 kg/m      Assessment & Plan:   Jay Shaw comes in today with chief complaint of Medical Management of Chronic Issues   Diagnosis and orders addressed:  1. Annual physical exam - CMP14+EGFR - CBC with Differential/Platelet - Lipid panel - PSA, total and free - TSH  2. Left ear impacted cerumen - CMP14+EGFR - CBC with Differential/Platelet  3. Acquired hypothyroidism - CMP14+EGFR - CBC with Differential/Platelet - TSH   Labs pending Health Maintenance reviewed Diet and exercise encouraged  Follow up plan: 1 year  Evelina Dun, FNP

## 2021-12-22 NOTE — Addendum Note (Signed)
Addended by: Baldomero Lamy B on: 12/22/2021 01:07 PM   Modules accepted: Orders

## 2021-12-22 NOTE — Patient Instructions (Signed)

## 2022-09-22 ENCOUNTER — Other Ambulatory Visit: Payer: Self-pay | Admitting: Family

## 2022-09-22 DIAGNOSIS — E039 Hypothyroidism, unspecified: Secondary | ICD-10-CM

## 2022-11-03 IMAGING — US US EXTREM LOW VENOUS*R*
1 series · 14 of 24 positions shown · non-contrast
Comparison: None.

CLINICAL DATA: Right calf pain, positive D-dimer

EXAM:
RIGHT LOWER EXTREMITY VENOUS DOPPLER ULTRASOUND
TECHNIQUE: Gray-scale sonography with compression, as well as color and duplex
ultrasound, were performed to evaluate the deep venous system(s)
from the level of the common femoral vein through the popliteal and
proximal calf veins.

[Series 1: us extrem low venous*right* · 0.07mm/px · 14 of 54 slices shown]
[im 1/54]
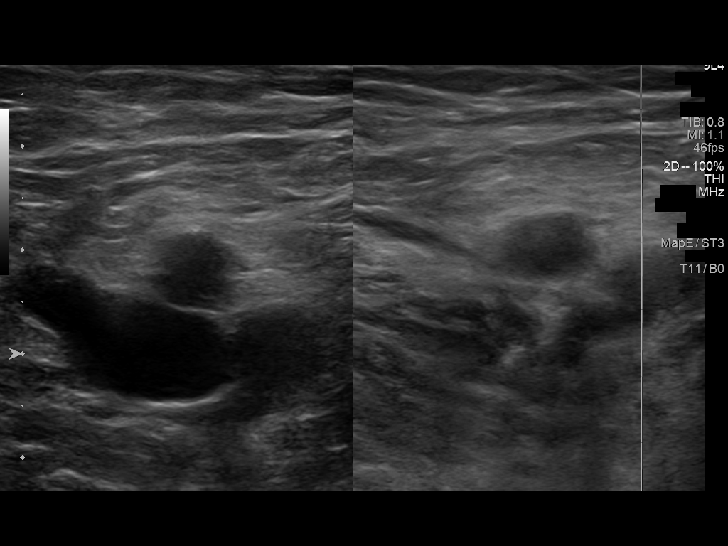
[im 5/54]
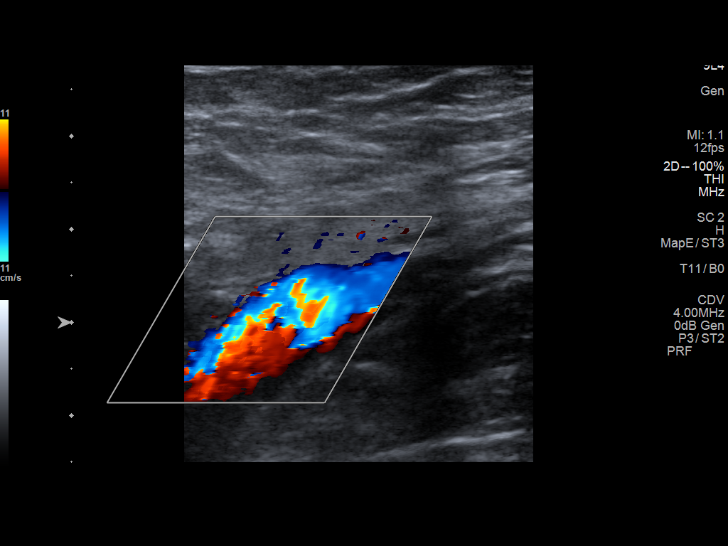
[im 10/54]
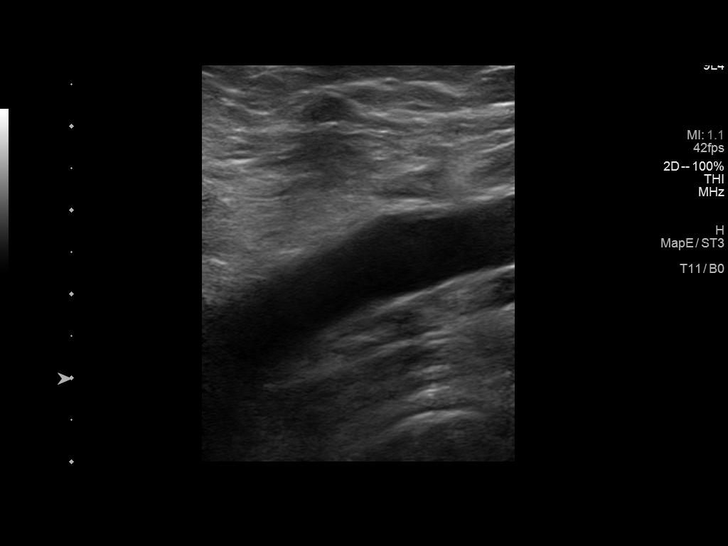
[im 14/54]
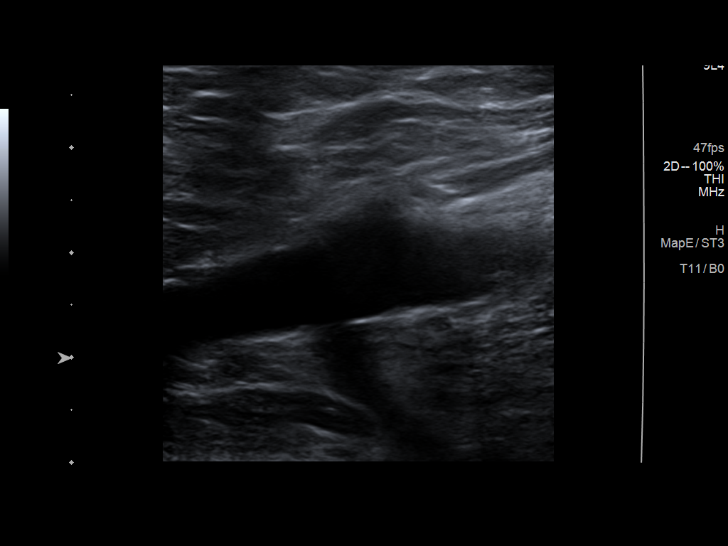
[im 17/54]
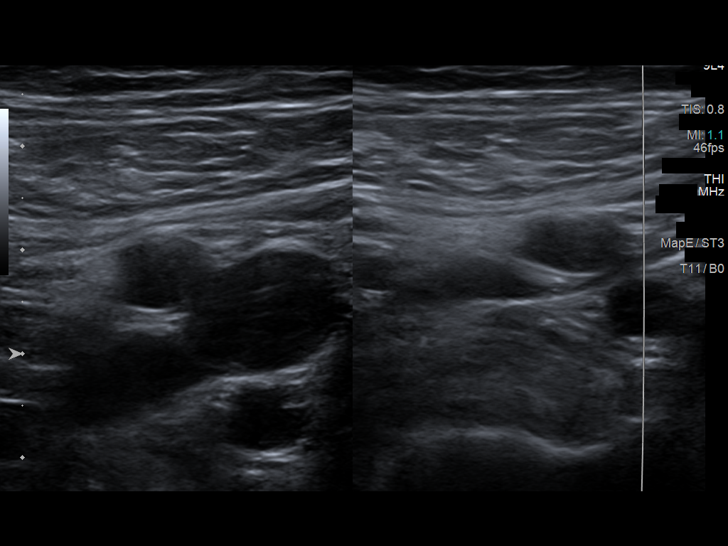
[im 21/54]
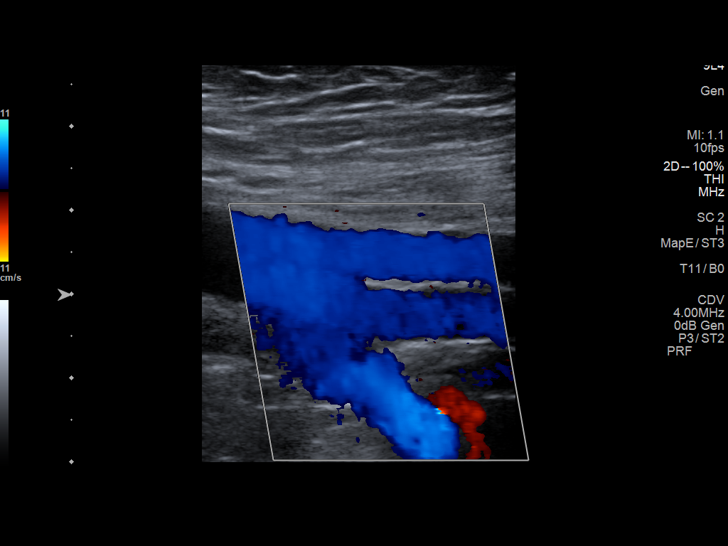
[im 26/54]
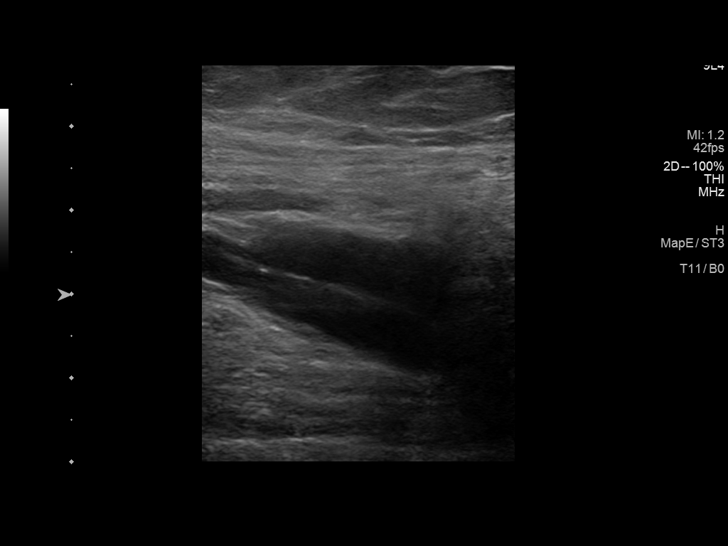
[im 28/54]
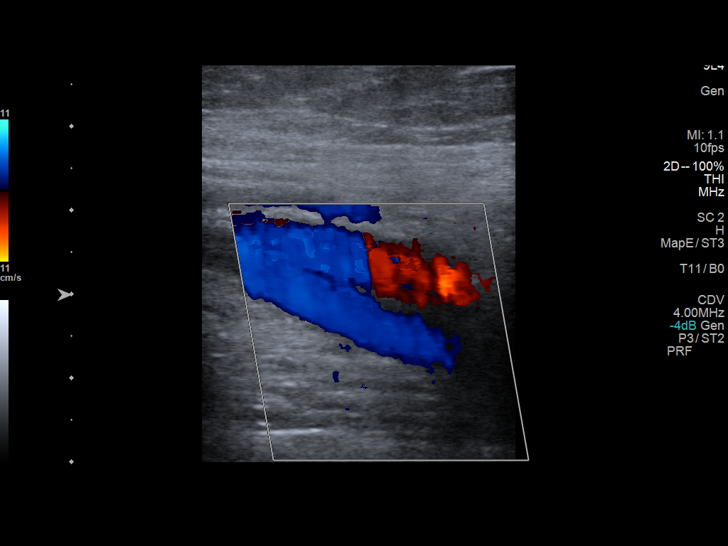
[im 33/54]
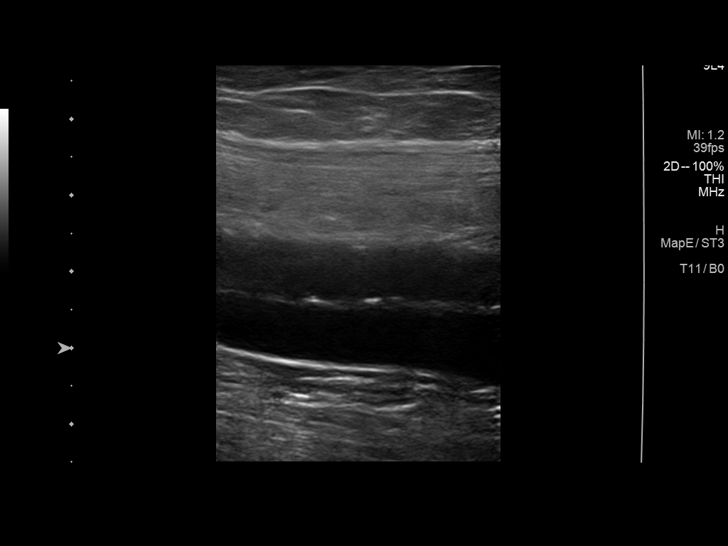
[im 37/54]
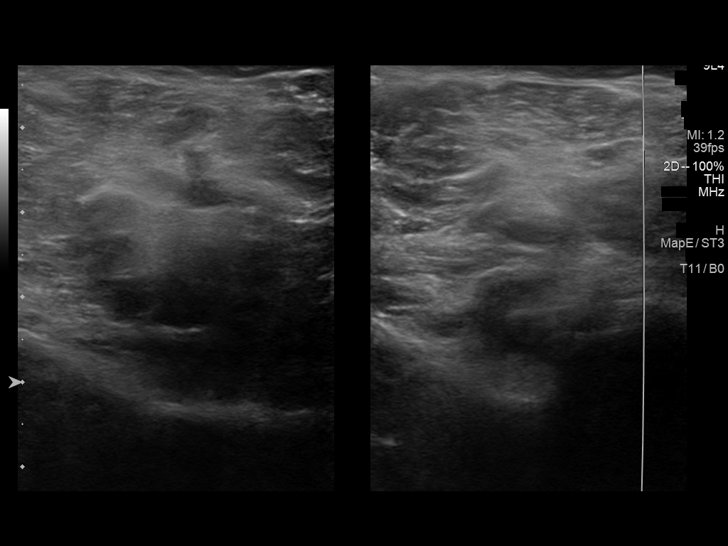
[im 42/54]
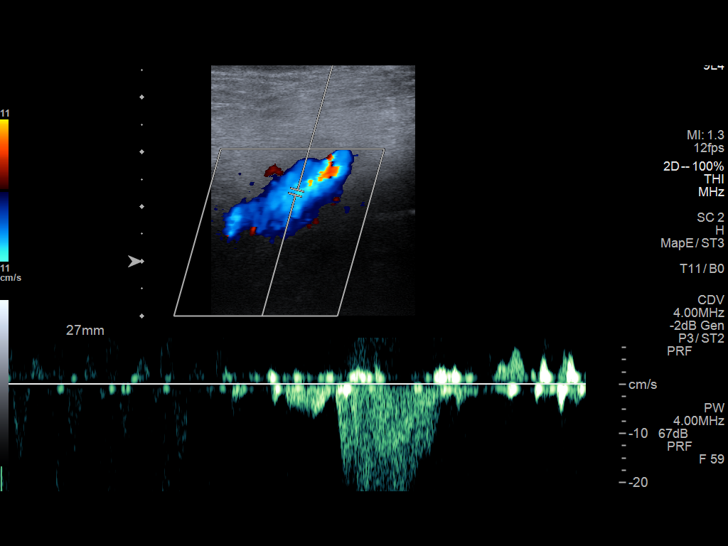
[im 44/54]
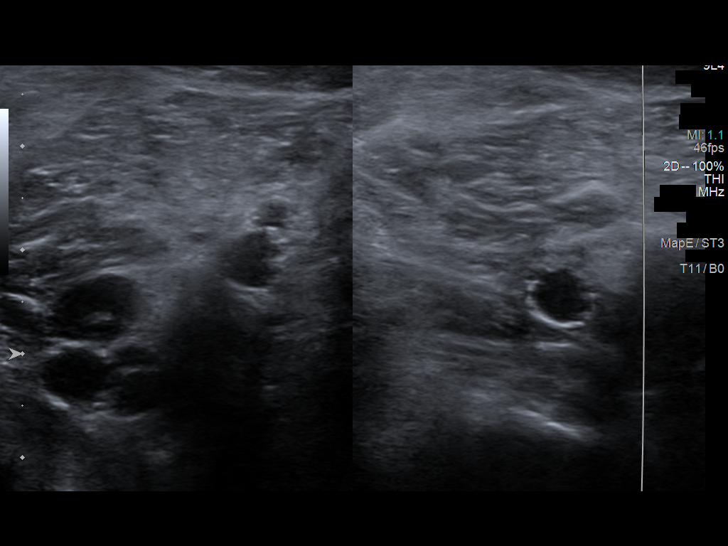
[im 49/54]
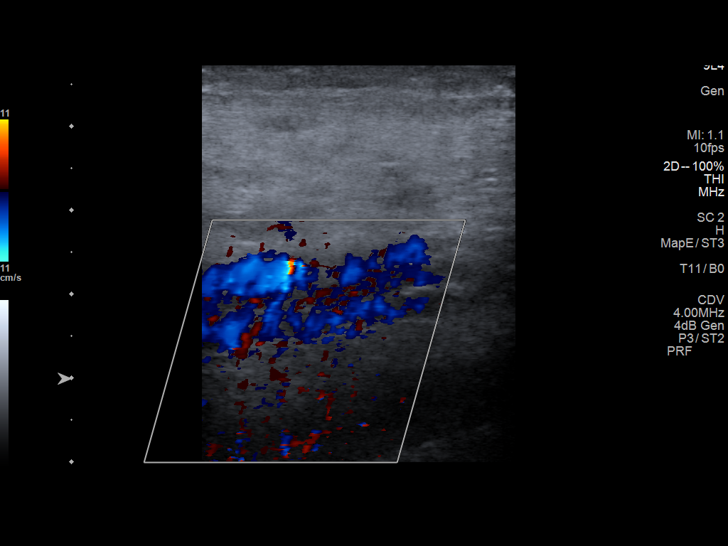
[im 54/54]
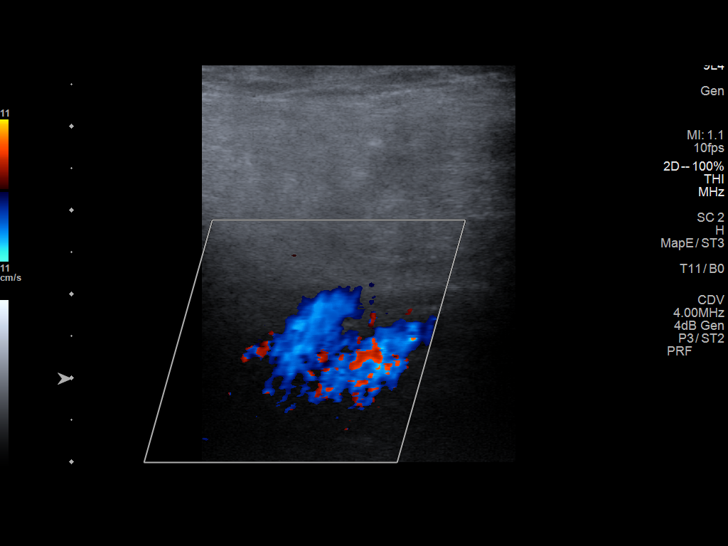

[14 of 24 positions shown; findings below may reference images not displayed]

FINDINGS: VENOUS

Normal compressibility of the common femoral, superficial femoral,
and popliteal veins, as well as the visualized calf veins.
Visualized portions of profunda femoral vein and great saphenous
vein unremarkable. No filling defects to suggest DVT on grayscale or
color Doppler imaging. Doppler waveforms show normal direction of
venous flow, normal respiratory phasicity and response to
augmentation.

Limited views of the contralateral common femoral vein are
unremarkable.

OTHER

None.

Limitations: none
IMPRESSION: No femoropopliteal DVT nor evidence of DVT within the visualized
calf veins.

If clinical symptoms are inconsistent or if there are persistent or
worsening symptoms, further imaging (possibly involving the iliac
veins) may be warranted.

## 2022-12-24 ENCOUNTER — Other Ambulatory Visit: Payer: Self-pay | Admitting: Family

## 2022-12-24 ENCOUNTER — Ambulatory Visit (INDEPENDENT_AMBULATORY_CARE_PROVIDER_SITE_OTHER): Payer: No Typology Code available for payment source | Admitting: Family

## 2022-12-24 ENCOUNTER — Encounter: Payer: Self-pay | Admitting: Family

## 2022-12-24 VITALS — BP 117/77 | HR 79 | Temp 97.2°F | Ht 67.0 in | Wt 190.4 lb

## 2022-12-24 DIAGNOSIS — Z0001 Encounter for general adult medical examination with abnormal findings: Secondary | ICD-10-CM | POA: Diagnosis not present

## 2022-12-24 DIAGNOSIS — Z0000 Encounter for general adult medical examination without abnormal findings: Secondary | ICD-10-CM

## 2022-12-24 DIAGNOSIS — E039 Hypothyroidism, unspecified: Secondary | ICD-10-CM

## 2022-12-24 NOTE — Progress Notes (Signed)
Subjective:    Patient ID: Jay Shaw, male    DOB: 08/19/1960, 62 y.o.   MRN: [id number]  Chief Complaint  Patient presents with   Annual Exam    Watery eyes last 2 days    PT presents to the office today for CPE. Complaining of watery eyes for the last two days. Has not tried any OTC.  Thyroid Problem Presents for follow-up visit. Patient reports no anxiety, constipation, depressed mood, diarrhea, dry skin, fatigue or heat intolerance. The symptoms have been stable.      Review of Systems  Constitutional:  Negative for fatigue.  Gastrointestinal:  Negative for constipation and diarrhea.  Endocrine: Negative for heat intolerance.  Psychiatric/Behavioral:  The patient is not nervous/anxious.   All other systems reviewed and are negative.  Family History  Problem Relation Age of Onset   Cancer Father    COPD Father    Heart disease Father    Social History   Socioeconomic History   Marital status: Married    Spouse name: Not on file   Number of children: Not on file   Years of education: Not on file   Highest education level: Not on file  Occupational History   Not on file  Tobacco Use   Smoking status: Never   Smokeless tobacco: Never  Vaping Use   Vaping status: Never Used  Substance and Sexual Activity   Alcohol use: No   Drug use: No   Sexual activity: Not on file  Other Topics Concern   Not on file  Social History Narrative   Not on file   Social Determinants of Health   Financial Resource Strain: Not on file  Food Insecurity: Not on file  Transportation Needs: Not on file  Physical Activity: Not on file  Stress: Not on file  Social Connections: Not on file        Objective:   Physical Exam Vitals reviewed.  Constitutional:      General: He is not in acute distress.    Appearance: He is well-developed.  HENT:     Head: Normocephalic.     Right Ear: Tympanic membrane normal.     Left Ear: Tympanic membrane normal.  Eyes:      General:        Right eye: No discharge.        Left eye: No discharge.     Pupils: Pupils are equal, round, and reactive to light.  Neck:     Thyroid: No thyromegaly.  Cardiovascular:     Rate and Rhythm: Normal rate and regular rhythm.     Heart sounds: Normal heart sounds. No murmur heard. Pulmonary:     Effort: Pulmonary effort is normal. No respiratory distress.     Breath sounds: Normal breath sounds. No wheezing.  Abdominal:     General: Bowel sounds are normal. There is no distension.     Palpations: Abdomen is soft.     Tenderness: There is no abdominal tenderness.  Musculoskeletal:        General: No tenderness. Normal range of motion.     Cervical back: Normal range of motion and neck supple.  Skin:    General: Skin is warm and dry.     Findings: No erythema or rash.  Neurological:     Mental Status: He is alert and oriented to person, place, and time.     Cranial Nerves: No cranial nerve deficit.     Deep Tendon Reflexes: Reflexes  are normal and symmetric.  Psychiatric:        Behavior: Behavior normal.        Thought Content: Thought content normal.        Judgment: Judgment normal.       BP 117/77   Pulse 79   Temp (!) 97.2 F (36.2 C) (Temporal)   Ht 5\' 7"  (1.702 m)   Wt 190 lb 6.4 oz (86.4 kg)   SpO2 95%   BMI 29.82 kg/m      Assessment & Plan:  Jaylun Segovia comes in today with chief complaint of Annual Exam (Watery eyes last 2 days )   Diagnosis and orders addressed:  1. Annual physical exam - CMP14+EGFR - CBC with Differential/Platelet - Lipid panel - TSH - PSA, total and free  2. Acquired hypothyroidism - CMP14+EGFR - CBC with Differential/Platelet - TSH   Labs pending Continue medications  Health Maintenance reviewed Diet and exercise encouraged  Follow up plan: 1 year    Jannifer Rodney, FNP

## 2022-12-24 NOTE — Patient Instructions (Signed)
Health Maintenance, Male Adopting a healthy lifestyle and getting preventive care are important in promoting health and wellness. Ask your health care provider about: The right schedule for you to have regular tests and exams. Things you can do on your own to prevent diseases and keep yourself healthy. What should I know about diet, weight, and exercise? Eat a healthy diet  Eat a diet that includes plenty of vegetables, fruits, low-fat dairy products, and lean protein. Do not eat a lot of foods that are high in solid fats, added sugars, or sodium. Maintain a healthy weight Body mass index (BMI) is a measurement that can be used to identify possible weight problems. It estimates body fat based on height and weight. Your health care provider can help determine your BMI and help you achieve or maintain a healthy weight. Get regular exercise Get regular exercise. This is one of the most important things you can do for your health. Most adults should: Exercise for at least 150 minutes each week. The exercise should increase your heart rate and make you sweat (moderate-intensity exercise). Do strengthening exercises at least twice a week. This is in addition to the moderate-intensity exercise. Spend less time sitting. Even light physical activity can be beneficial. Watch cholesterol and blood lipids Have your blood tested for lipids and cholesterol at 62 years of age, then have this test every 5 years. You may need to have your cholesterol levels checked more often if: Your lipid or cholesterol levels are high. You are older than 62 years of age. You are at high risk for heart disease. What should I know about cancer screening? Many types of cancers can be detected early and may often be prevented. Depending on your health history and family history, you may need to have cancer screening at various ages. This may include screening for: Colorectal cancer. Prostate cancer. Skin cancer. Lung  cancer. What should I know about heart disease, diabetes, and high blood pressure? Blood pressure and heart disease High blood pressure causes heart disease and increases the risk of stroke. This is more likely to develop in people who have high blood pressure readings or are overweight. Talk with your health care provider about your target blood pressure readings. Have your blood pressure checked: Every 3-5 years if you are 18-39 years of age. Every year if you are 40 years old or older. If you are between the ages of 65 and 75 and are a current or former smoker, ask your health care provider if you should have a one-time screening for abdominal aortic aneurysm (AAA). Diabetes Have regular diabetes screenings. This checks your fasting blood sugar level. Have the screening done: Once every three years after age 45 if you are at a normal weight and have a low risk for diabetes. More often and at a younger age if you are overweight or have a high risk for diabetes. What should I know about preventing infection? Hepatitis B If you have a higher risk for hepatitis B, you should be screened for this virus. Talk with your health care provider to find out if you are at risk for hepatitis B infection. Hepatitis C Blood testing is recommended for: Everyone born from 1945 through 1965. Anyone with known risk factors for hepatitis C. Sexually transmitted infections (STIs) You should be screened each year for STIs, including gonorrhea and chlamydia, if: You are sexually active and are younger than 62 years of age. You are older than 62 years of age and your   health care provider tells you that you are at risk for this type of infection. Your sexual activity has changed since you were last screened, and you are at increased risk for chlamydia or gonorrhea. Ask your health care provider if you are at risk. Ask your health care provider about whether you are at high risk for HIV. Your health care provider  may recommend a prescription medicine to help prevent HIV infection. If you choose to take medicine to prevent HIV, you should first get tested for HIV. You should then be tested every 3 months for as long as you are taking the medicine. Follow these instructions at home: Alcohol use Do not drink alcohol if your health care provider tells you not to drink. If you drink alcohol: Limit how much you have to 0-2 drinks a day. Know how much alcohol is in your drink. In the [place] one drink equals one 12 oz bottle of beer (355 mL), one 5 oz glass of wine (148 mL), or one 1 oz glass of hard liquor (44 mL). Lifestyle Do not use any products that contain nicotine or tobacco. These products include cigarettes, chewing tobacco, and vaping devices, such as e-cigarettes. If you need help quitting, ask your health care provider. Do not use street drugs. Do not share needles. Ask your health care provider for help if you need support or information about quitting drugs. General instructions Schedule regular health, dental, and eye exams. Stay current with your vaccines. Tell your health care provider if: You often feel depressed. You have ever been abused or do not feel safe at home. Summary Adopting a healthy lifestyle and getting preventive care are important in promoting health and wellness. Follow your health care provider's instructions about healthy diet, exercising, and getting tested or screened for diseases. Follow your health care provider's instructions on monitoring your cholesterol and blood pressure. This information is not intended to replace advice given to you by your health care provider. Make sure you discuss any questions you have with your health care provider. Document Revised: 10/14/2020 Document Reviewed: 10/14/2020 Elsevier Patient Education  2024 [employer]  

## 2022-12-25 ENCOUNTER — Other Ambulatory Visit: Payer: Self-pay | Admitting: Family

## 2022-12-25 DIAGNOSIS — E785 Hyperlipidemia, unspecified: Secondary | ICD-10-CM | POA: Insufficient documentation

## 2022-12-25 LAB — CMP14+EGFR
ALT: 23 IU/L (ref 0–44)
AST: 22 IU/L (ref 0–40)
Albumin: 4.2 g/dL (ref 3.9–4.9)
Alkaline Phosphatase: 64 IU/L (ref 44–121)
BUN/Creatinine Ratio: 15 (ref 10–24)
BUN: 15 mg/dL (ref 8–27)
Bilirubin Total: 0.5 mg/dL (ref 0.0–1.2)
CO2: 24 mmol/L (ref 20–29)
Calcium: 9.5 mg/dL (ref 8.6–10.2)
Chloride: 105 mmol/L (ref 96–106)
Creatinine, Ser: 0.98 mg/dL (ref 0.76–1.27)
Globulin, Total: 2.2 g/dL (ref 1.5–4.5)
Glucose: 101 mg/dL — ABNORMAL HIGH (ref 70–99)
Potassium: 4.6 mmol/L (ref 3.5–5.2)
Sodium: 142 mmol/L (ref 134–144)
Total Protein: 6.4 g/dL (ref 6.0–8.5)
eGFR: 87 mL/min/{1.73_m2} (ref >59)

## 2022-12-25 LAB — CBC WITH DIFFERENTIAL/PLATELET
Basophils Absolute: 0 10*3/uL (ref 0.0–0.2)
Basos: 1 %
EOS (ABSOLUTE): 0.2 10*3/uL (ref 0.0–0.4)
Eos: 3 %
Hematocrit: 43.4 % (ref 37.5–51.0)
Hemoglobin: 14.7 g/dL (ref 13.0–17.7)
Immature Grans (Abs): 0 10*3/uL (ref 0.0–0.1)
Immature Granulocytes: 0 %
Lymphocytes Absolute: 1.4 10*3/uL (ref 0.7–3.1)
Lymphs: 25 %
MCH: 30.1 pg (ref 26.6–33.0)
MCHC: 33.9 g/dL (ref 31.5–35.7)
MCV: 89 fL (ref 79–97)
Monocytes Absolute: 0.4 10*3/uL (ref 0.1–0.9)
Monocytes: 8 %
Neutrophils Absolute: 3.5 10*3/uL (ref 1.4–7.0)
Neutrophils: 63 %
Platelets: 165 10*3/uL (ref 150–450)
RBC: 4.89 x10E6/uL (ref 4.14–5.80)
RDW: 12.8 % (ref 11.6–15.4)
WBC: 5.6 10*3/uL (ref 3.4–10.8)

## 2022-12-25 LAB — PSA, TOTAL AND FREE
PSA, Free Pct: 21.5 %
PSA, Free: 0.28 ng/mL
Prostate Specific Ag, Serum: 1.3 ng/mL (ref 0.0–4.0)

## 2022-12-25 LAB — LIPID PANEL
Chol/HDL Ratio: 3.9 ratio (ref 0.0–5.0)
Cholesterol, Total: 180 mg/dL (ref 100–199)
HDL: 46 mg/dL (ref >39)
LDL Chol Calc (NIH): 115 mg/dL — ABNORMAL HIGH (ref 0–99)
Triglycerides: 103 mg/dL (ref 0–149)
VLDL Cholesterol Cal: 19 mg/dL (ref 5–40)

## 2022-12-25 LAB — TSH: TSH: 5.32 u[IU]/mL — ABNORMAL HIGH (ref 0.450–4.500)

## 2022-12-25 MED ORDER — LEVOTHYROXINE SODIUM 75 MCG PO TABS: 75.0000 ug | ORAL_TABLET | Freq: Every day | ORAL | 1 refills | Status: DC

## 2022-12-25 MED ORDER — ROSUVASTATIN CALCIUM 10 MG PO TABS: 10.0000 mg | ORAL_TABLET | Freq: Every day | ORAL | 3 refills | Status: DC

## 2022-12-25 NOTE — Progress Notes (Signed)
Patient r/c  

## 2023-02-22 ENCOUNTER — Encounter: Payer: Self-pay | Admitting: Family

## 2023-02-22 ENCOUNTER — Ambulatory Visit: Payer: No Typology Code available for payment source | Admitting: Family

## 2023-02-22 VITALS — BP 109/72 | HR 92 | Temp 97.4°F | Ht 67.0 in | Wt 193.0 lb

## 2023-02-22 DIAGNOSIS — E039 Hypothyroidism, unspecified: Secondary | ICD-10-CM

## 2023-02-22 DIAGNOSIS — Z8249 Family history of ischemic heart disease and other diseases of the circulatory system: Secondary | ICD-10-CM | POA: Diagnosis not present

## 2023-02-22 DIAGNOSIS — E785 Hyperlipidemia, unspecified: Secondary | ICD-10-CM | POA: Diagnosis not present

## 2023-02-22 NOTE — Progress Notes (Signed)
Subjective:    Patient ID: Jay Shaw, male    DOB: 1961-02-27, 62 y.o.   MRN: [id number]  Chief Complaint  Patient presents with   Hypothyroidism   PT presents to the office today to recheck TSH. He was seen on 12/24/22 and found to have an elevated TSH. We increased his levothyroxine to 75 mcg form 50 mcg. He reports he is doing well.   He reports he is unable to lose weight.  Thyroid Problem Presents for follow-up visit. Patient reports no anxiety, cold intolerance, constipation, diarrhea, dry skin, fatigue or leg swelling. The symptoms have been stable. His past medical history is significant for hyperlipidemia.  Hyperlipidemia This is a chronic problem. The current episode started more than 1 year ago. The problem is uncontrolled. Current antihyperlipidemic treatment includes statins. The current treatment provides mild improvement of lipids.      Review of Systems  Constitutional:  Negative for fatigue.  Gastrointestinal:  Negative for constipation and diarrhea.  Endocrine: Negative for cold intolerance.  Psychiatric/Behavioral:  The patient is not nervous/anxious.   All other systems reviewed and are negative.      Objective:   Physical Exam Vitals reviewed.  Constitutional:      General: He is not in acute distress.    Appearance: He is well-developed.  HENT:     Head: Normocephalic.  Eyes:     General:        Right eye: No discharge.        Left eye: No discharge.     Pupils: Pupils are equal, round, and reactive to light.  Neck:     Thyroid: No thyromegaly.  Cardiovascular:     Rate and Rhythm: Normal rate and regular rhythm.     Heart sounds: Normal heart sounds. No murmur heard. Pulmonary:     Effort: Pulmonary effort is normal. No respiratory distress.     Breath sounds: Normal breath sounds. No wheezing.  Abdominal:     General: Bowel sounds are normal. There is no distension.     Palpations: Abdomen is soft.     Tenderness: There is no  abdominal tenderness.  Musculoskeletal:        General: No tenderness. Normal range of motion.     Cervical back: Normal range of motion and neck supple.  Skin:    General: Skin is warm and dry.     Findings: No erythema or rash.  Neurological:     Mental Status: He is alert and oriented to person, place, and time.     Cranial Nerves: No cranial nerve deficit.     Deep Tendon Reflexes: Reflexes are normal and symmetric.  Psychiatric:        Behavior: Behavior normal.        Thought Content: Thought content normal.        Judgment: Judgment normal.      BP 109/72   Pulse 92   Temp (!) 97.4 F (36.3 C) (Temporal)   Ht 5\' 7"  (1.702 m)   Wt 193 lb (87.5 kg)   SpO2 93%   BMI 30.23 kg/m      Assessment & Plan:  Jay Shaw comes in today with chief complaint of Hypothyroidism   Diagnosis and orders addressed:  1. Acquired hypothyroidism Labs pending  - TSH  2. Hyperlipidemia, unspecified hyperlipidemia type - Ambulatory referral to Cardiology  3. Family history of cardiac disorder in father - Ambulatory referral to Cardiology   Labs pending Continue current medications  Health Maintenance reviewed Diet and exercise encouraged  Follow up plan: 1 year   Jay Rodney, FNP

## 2023-02-23 LAB — TSH: TSH: 1.31 u[IU]/mL (ref 0.450–4.500)

## 2023-05-26 ENCOUNTER — Ambulatory Visit: Payer: No Typology Code available for payment source | Admitting: Cardiology

## 2023-06-29 ENCOUNTER — Other Ambulatory Visit: Payer: Self-pay | Admitting: Family

## 2023-07-05 ENCOUNTER — Ambulatory Visit: Payer: No Typology Code available for payment source | Admitting: Cardiology

## 2023-08-10 ENCOUNTER — Telehealth: Payer: Self-pay | Admitting: Family Medicine

## 2023-08-10 NOTE — Telephone Encounter (Signed)
 Copied from CRM [id number]. Topic: Referral - Request for Referral >> Aug 10, 2023  9:34 AM Carlatta H wrote: Did the patient discuss referral with their provider in the last year? No (If No - schedule appointment) (If Yes - send message)  Appointment offered? No  Type of order/referral and detailed reason for visit: Knee and left arm  Preference of office, provider, location: [place]  If referral order, have you been seen by this specialty before? No (If Yes, this issue or another issue? When? Where?  Can we respond through MyChart? Yes

## 2023-08-10 NOTE — Telephone Encounter (Signed)
 CALLED TO SCHEDULE APPT FOR REFERRAL AND HE SAID HE MADE APPT WITH ORTHO AND DIDN'T NEED IT.

## 2023-12-27 ENCOUNTER — Encounter: Payer: No Typology Code available for payment source | Admitting: Family

## 2023-12-28 ENCOUNTER — Other Ambulatory Visit: Payer: Self-pay | Admitting: Family

## 2024-01-31 ENCOUNTER — Encounter: Payer: Self-pay | Admitting: Family

## 2024-01-31 ENCOUNTER — Other Ambulatory Visit: Payer: Self-pay | Admitting: Family

## 2024-01-31 NOTE — Telephone Encounter (Signed)
 Christy pt NTBS 30-d given 12/28/23

## 2024-01-31 NOTE — Telephone Encounter (Signed)
 Letter mailed

## 2024-03-03 ENCOUNTER — Ambulatory Visit: Admitting: Family

## 2024-03-03 VITALS — BP 112/75 | HR 91 | Temp 97.9°F | Ht 67.0 in | Wt 200.2 lb

## 2024-03-03 DIAGNOSIS — E039 Hypothyroidism, unspecified: Secondary | ICD-10-CM

## 2024-03-03 DIAGNOSIS — Z0001 Encounter for general adult medical examination with abnormal findings: Secondary | ICD-10-CM

## 2024-03-03 DIAGNOSIS — Z0000 Encounter for general adult medical examination without abnormal findings: Secondary | ICD-10-CM

## 2024-03-03 DIAGNOSIS — E785 Hyperlipidemia, unspecified: Secondary | ICD-10-CM | POA: Diagnosis not present

## 2024-03-03 MED ORDER — LEVOTHYROXINE SODIUM 75 MCG PO TABS: 75.0000 ug | ORAL_TABLET | Freq: Every day | ORAL | 3 refills | Status: AC

## 2024-03-03 MED ORDER — ROSUVASTATIN CALCIUM 10 MG PO TABS
10.0000 mg | ORAL_TABLET | Freq: Every day | ORAL | 3 refills | Status: AC
Start: 2024-03-03 — End: ?

## 2024-03-03 NOTE — Patient Instructions (Signed)
 Health Maintenance, Male  Adopting a healthy lifestyle and getting preventive care are important in promoting health and wellness. Ask your health care provider about:  The right schedule for you to have regular tests and exams.  Things you can do on your own to prevent diseases and keep yourself healthy.  What should I know about diet, weight, and exercise?  Eat a healthy diet    Eat a diet that includes plenty of vegetables, fruits, low-fat dairy products, and lean protein.  Do not eat a lot of foods that are high in solid fats, added sugars, or sodium.  Maintain a healthy weight  Body mass index (BMI) is a measurement that can be used to identify possible weight problems. It estimates body fat based on height and weight. Your health care provider can help determine your BMI and help you achieve or maintain a healthy weight.  Get regular exercise  Get regular exercise. This is one of the most important things you can do for your health. Most adults should:  Exercise for at least 150 minutes each week. The exercise should increase your heart rate and make you sweat (moderate-intensity exercise).  Do strengthening exercises at least twice a week. This is in addition to the moderate-intensity exercise.  Spend less time sitting. Even light physical activity can be beneficial.  Watch cholesterol and blood lipids  Have your blood tested for lipids and cholesterol at 63 years of age, then have this test every 5 years.  You may need to have your cholesterol levels checked more often if:  Your lipid or cholesterol levels are high.  You are older than 63 years of age.  You are at high risk for heart disease.  What should I know about cancer screening?  Many types of cancers can be detected early and may often be prevented. Depending on your health history and family history, you may need to have cancer screening at various ages. This may include screening for:  Colorectal cancer.  Prostate cancer.  Skin cancer.  Lung  cancer.  What should I know about heart disease, diabetes, and high blood pressure?  Blood pressure and heart disease  High blood pressure causes heart disease and increases the risk of stroke. This is more likely to develop in people who have high blood pressure readings or are overweight.  Talk with your health care provider about your target blood pressure readings.  Have your blood pressure checked:  Every 3-5 years if you are 24-52 years of age.  Every year if you are 3 years old or older.  If you are between the ages of 60 and 72 and are a current or former smoker, ask your health care provider if you should have a one-time screening for abdominal aortic aneurysm (AAA).  Diabetes  Have regular diabetes screenings. This checks your fasting blood sugar level. Have the screening done:  Once every three years after age 66 if you are at a normal weight and have a low risk for diabetes.  More often and at a younger age if you are overweight or have a high risk for diabetes.  What should I know about preventing infection?  Hepatitis B  If you have a higher risk for hepatitis B, you should be screened for this virus. Talk with your health care provider to find out if you are at risk for hepatitis B infection.  Hepatitis C  Blood testing is recommended for:  Everyone born from [birth year] through 1965.  Anyone  with known risk factors for hepatitis C.  Sexually transmitted infections (STIs)  You should be screened each year for STIs, including gonorrhea and chlamydia, if:  You are sexually active and are younger than 63 years of age.  You are older than 63 years of age and your health care provider tells you that you are at risk for this type of infection.  Your sexual activity has changed since you were last screened, and you are at increased risk for chlamydia or gonorrhea. Ask your health care provider if you are at risk.  Ask your health care provider about whether you are at high risk for HIV. Your health care provider  may recommend a prescription medicine to help prevent HIV infection. If you choose to take medicine to prevent HIV, you should first get tested for HIV. You should then be tested every 3 months for as long as you are taking the medicine.  Follow these instructions at home:  Alcohol use  Do not drink alcohol if your health care provider tells you not to drink.  If you drink alcohol:  Limit how much you have to 0-2 drinks a day.  Know how much alcohol is in your drink. In the [place] one drink equals one 12 oz bottle of beer (355 mL), one 5 oz glass of wine (148 mL), or one 1 oz glass of hard liquor (44 mL).  Lifestyle  Do not use any products that contain nicotine or tobacco. These products include cigarettes, chewing tobacco, and vaping devices, such as e-cigarettes. If you need help quitting, ask your health care provider.  Do not use street drugs.  Do not share needles.  Ask your health care provider for help if you need support or information about quitting drugs.  General instructions  Schedule regular health, dental, and eye exams.  Stay current with your vaccines.  Tell your health care provider if:  You often feel depressed.  You have ever been abused or do not feel safe at home.  Summary  Adopting a healthy lifestyle and getting preventive care are important in promoting health and wellness.  Follow your health care provider's instructions about healthy diet, exercising, and getting tested or screened for diseases.  Follow your health care provider's instructions on monitoring your cholesterol and blood pressure.  This information is not intended to replace advice given to you by your health care provider. Make sure you discuss any questions you have with your health care provider.  Document Revised: 10/14/2020 Document Reviewed: 10/14/2020  Elsevier Patient Education  2024 [employer].

## 2024-03-03 NOTE — Progress Notes (Signed)
 Subjective:    Patient ID: Jay Shaw, male    DOB: 1960-08-07, 63 y.o.   MRN: [id number]  Chief Complaint  Patient presents with   Annual Exam    Discuss wt gain   PT presents to the office today for CPE.   He is followed by Ortho. Has rotor cuff tear and is scheduled for surgery.  Thyroid Problem Presents for follow-up visit. Patient reports no anxiety, cold intolerance, constipation, diarrhea, dry skin, fatigue or leg swelling. The symptoms have been stable.  Hyperlipidemia This is a chronic problem. The current episode started more than 1 year ago. The problem is uncontrolled. Recent lipid tests were reviewed and are high. Current antihyperlipidemic treatment includes statins. The current treatment provides mild improvement of lipids. Risk factors for coronary artery disease include dyslipidemia, hypertension, male sex and a sedentary lifestyle.      Review of Systems  Constitutional:  Negative for fatigue.  Gastrointestinal:  Negative for constipation and diarrhea.  Endocrine: Negative for cold intolerance.  Psychiatric/Behavioral:  The patient is not nervous/anxious.   All other systems reviewed and are negative.  Family History  Problem Relation Age of Onset   Cancer Father    COPD Father    Heart disease Father    Social History   Socioeconomic History   Marital status: Married    Spouse name: Not on file   Number of children: Not on file   Years of education: Not on file   Highest education level: Associate degree: academic program  Occupational History   Not on file  Tobacco Use   Smoking status: Never   Smokeless tobacco: Never  Vaping Use   Vaping status: Never Used  Substance and Sexual Activity   Alcohol use: No   Drug use: No   Sexual activity: Not on file  Other Topics Concern   Not on file  Social History Narrative   Not on file   Social Drivers of Health   Financial Resource Strain: Low Risk  (03/03/2024)   Overall Financial Resource  Strain (CARDIA)    Difficulty of Paying Living Expenses: Not hard at all  Food Insecurity: No Food Insecurity (03/03/2024)   Hunger Vital Sign    Worried About Running Out of Food in the Last Year: Never true    Ran Out of Food in the Last Year: Never true  Transportation Needs: No Transportation Needs (03/03/2024)   PRAPARE - Administrator, Civil Service (Medical): No    Lack of Transportation (Non-Medical): No  Physical Activity: Insufficiently Active (03/03/2024)   Exercise Vital Sign    Days of Exercise per Week: 3 days    Minutes of Exercise per Session: 20 min  Stress: No Stress Concern Present (03/03/2024)   [employer] of Occupational Health - Occupational Stress Questionnaire    Feeling of Stress: Not at all  Social Connections: Socially Integrated (03/03/2024)   Social Connection and Isolation Panel    Frequency of Communication with Friends and Family: More than three times a week    Frequency of Social Gatherings with Friends and Family: Once a week    Attends Religious Services: More than 4 times per year    Active Member of [employer] or Organizations: Yes    Attends Engineer, structural: More than 4 times per year    Marital Status: Married       Objective:   Physical Exam Vitals reviewed.  Constitutional:      General: He  is not in acute distress.    Appearance: He is well-developed.  HENT:     Head: Normocephalic.     Right Ear: Tympanic membrane normal.     Left Ear: Tympanic membrane normal.  Eyes:     General:        Right eye: No discharge.        Left eye: No discharge.     Pupils: Pupils are equal, round, and reactive to light.  Neck:     Thyroid: No thyromegaly.  Cardiovascular:     Rate and Rhythm: Normal rate and regular rhythm.     Heart sounds: Normal heart sounds. No murmur heard. Pulmonary:     Effort: Pulmonary effort is normal. No respiratory distress.     Breath sounds: Normal breath sounds. No wheezing.  Abdominal:      General: Bowel sounds are normal. There is no distension.     Palpations: Abdomen is soft.     Tenderness: There is no abdominal tenderness.  Musculoskeletal:        General: No tenderness. Normal range of motion.     Cervical back: Normal range of motion and neck supple.  Skin:    General: Skin is warm and dry.     Findings: No erythema or rash.  Neurological:     Mental Status: He is alert and oriented to person, place, and time.     Cranial Nerves: No cranial nerve deficit.     Deep Tendon Reflexes: Reflexes are normal and symmetric.  Psychiatric:        Behavior: Behavior normal.        Thought Content: Thought content normal.        Judgment: Judgment normal.      BP 112/75   Pulse 91   Temp 97.9 F (36.6 C) (Temporal)   Ht 5' 7 (1.702 m)   Wt 200 lb 3.2 oz (90.8 kg)   SpO2 94%   BMI 31.36 kg/m      Assessment & Plan:  Saintclair Winecoff comes in today with chief complaint of Annual Exam (Discuss wt gain)   Diagnosis and orders addressed:  1. Annual physical exam (Primary) - CBC with Differential/Platelet - CMP14+EGFR - Lipid panel - TSH - PSA, total and free  2. Acquired hypothyroidism - levothyroxine  (SYNTHROID ) 75 MCG tablet; Take 1 tablet (75 mcg total) by mouth daily.  Dispense: 90 tablet; Refill: 3 - CBC with Differential/Platelet - CMP14+EGFR  3. Hyperlipidemia, unspecified hyperlipidemia type - rosuvastatin  (CRESTOR ) 10 MG tablet; Take 1 tablet (10 mg total) by mouth daily.  Dispense: 90 tablet; Refill: 3 - CBC with Differential/Platelet - CMP14+EGFR   Labs pending Continue current medications  Health Maintenance reviewed Diet and exercise encouraged  Follow up plan: 1 year   Bari Learn, FNP

## 2024-03-04 LAB — CBC WITH DIFFERENTIAL/PLATELET
Basophils Absolute: 0 x10E3/uL (ref 0.0–0.2)
Basos: 1 %
EOS (ABSOLUTE): 0.2 x10E3/uL (ref 0.0–0.4)
Eos: 3 %
Hematocrit: 44.8 % (ref 37.5–51.0)
Hemoglobin: 14.8 g/dL (ref 13.0–17.7)
Immature Grans (Abs): 0 x10E3/uL (ref 0.0–0.1)
Immature Granulocytes: 0 %
Lymphocytes Absolute: 1.5 x10E3/uL (ref 0.7–3.1)
Lymphs: 23 %
MCH: 30.3 pg (ref 26.6–33.0)
MCHC: 33 g/dL (ref 31.5–35.7)
MCV: 92 fL (ref 79–97)
Monocytes Absolute: 0.5 x10E3/uL (ref 0.1–0.9)
Monocytes: 8 %
Neutrophils Absolute: 4.1 x10E3/uL (ref 1.4–7.0)
Neutrophils: 65 %
Platelets: 174 x10E3/uL (ref 150–450)
RBC: 4.89 x10E6/uL (ref 4.14–5.80)
RDW: 12.8 % (ref 11.6–15.4)
WBC: 6.3 x10E3/uL (ref 3.4–10.8)

## 2024-03-04 LAB — CMP14+EGFR
ALT: 34 IU/L (ref 0–44)
AST: 25 IU/L (ref 0–40)
Albumin: 4.1 g/dL (ref 3.9–4.9)
Alkaline Phosphatase: 63 IU/L (ref 47–123)
BUN/Creatinine Ratio: 14 (ref 10–24)
BUN: 14 mg/dL (ref 8–27)
Bilirubin Total: 0.6 mg/dL (ref 0.0–1.2)
CO2: 24 mmol/L (ref 20–29)
Calcium: 9.5 mg/dL (ref 8.6–10.2)
Chloride: 103 mmol/L (ref 96–106)
Creatinine, Ser: 1 mg/dL (ref 0.76–1.27)
Globulin, Total: 2.2 g/dL (ref 1.5–4.5)
Glucose: 79 mg/dL (ref 70–99)
Potassium: 4.5 mmol/L (ref 3.5–5.2)
Sodium: 141 mmol/L (ref 134–144)
Total Protein: 6.3 g/dL (ref 6.0–8.5)
eGFR: 85 mL/min/1.73 (ref >59)

## 2024-03-04 LAB — LIPID PANEL
Chol/HDL Ratio: 3.8 ratio (ref 0.0–5.0)
Cholesterol, Total: 144 mg/dL (ref 100–199)
HDL: 38 mg/dL — ABNORMAL LOW (ref >39)
LDL Chol Calc (NIH): 76 mg/dL (ref 0–99)
Triglycerides: 174 mg/dL — ABNORMAL HIGH (ref 0–149)
VLDL Cholesterol Cal: 30 mg/dL (ref 5–40)

## 2024-03-04 LAB — TSH: TSH: 4.41 u[IU]/mL (ref 0.450–4.500)

## 2024-03-04 LAB — PSA, TOTAL AND FREE
PSA, Free Pct: 20 %
PSA, Free: 0.22 ng/mL
Prostate Specific Ag, Serum: 1.1 ng/mL (ref 0.0–4.0)

## 2024-03-06 ENCOUNTER — Ambulatory Visit: Payer: Self-pay | Admitting: Family

## 2024-03-06 ENCOUNTER — Ambulatory Visit: Admitting: Family

## 2024-03-29 ENCOUNTER — Other Ambulatory Visit: Payer: Self-pay | Admitting: Family

## 2024-03-29 DIAGNOSIS — E039 Hypothyroidism, unspecified: Secondary | ICD-10-CM

## 2025-03-06 ENCOUNTER — Encounter: Payer: Self-pay | Admitting: Family
